# Patient Record
Sex: Male | Born: 1965 | Race: White | Hispanic: No | Marital: Married | Smoking: Never smoker
Health system: Southern US, Community
[De-identification: ages and names within clinical notes are randomized; demographics above are authoritative.]

## PROBLEM LIST (undated history)

## (undated) DIAGNOSIS — E213 Hyperparathyroidism, unspecified: Secondary | ICD-10-CM

## (undated) DIAGNOSIS — F419 Anxiety disorder, unspecified: Secondary | ICD-10-CM

## (undated) DIAGNOSIS — I10 Essential (primary) hypertension: Secondary | ICD-10-CM

## (undated) HISTORY — PX: OTHER SURGICAL HISTORY: SHX169

## (undated) HISTORY — PX: EYE SURGERY: SHX253

---

## 2009-09-26 ENCOUNTER — Ambulatory Visit (HOSPITAL_COMMUNITY): Admission: RE | Admit: 2009-09-26 | Discharge: 2009-09-26 | Payer: Self-pay | Admitting: Family Medicine

## 2009-09-29 ENCOUNTER — Ambulatory Visit (HOSPITAL_COMMUNITY): Admission: RE | Admit: 2009-09-29 | Discharge: 2009-09-29 | Payer: Self-pay | Admitting: Family Medicine

## 2012-07-18 ENCOUNTER — Encounter: Payer: Self-pay | Admitting: Gastroenterology

## 2012-07-31 ENCOUNTER — Encounter: Payer: Self-pay | Admitting: Gastroenterology

## 2012-07-31 ENCOUNTER — Ambulatory Visit (INDEPENDENT_AMBULATORY_CARE_PROVIDER_SITE_OTHER): Payer: Federal, State, Local not specified - PPO | Admitting: Gastroenterology

## 2012-07-31 VITALS — BP 130/90 | HR 80 | Ht 66.75 in | Wt 208.4 lb

## 2012-07-31 DIAGNOSIS — R1012 Left upper quadrant pain: Secondary | ICD-10-CM

## 2012-07-31 DIAGNOSIS — K644 Residual hemorrhoidal skin tags: Secondary | ICD-10-CM | POA: Insufficient documentation

## 2012-07-31 NOTE — Assessment & Plan Note (Signed)
Patient is asymptomatic.

## 2012-07-31 NOTE — Progress Notes (Signed)
History of Present Illness:  This 46 year old white male referred at the request of Dr. Regino Schultze for evaluation of abdominal pain. For approximately 2 months he was complaining of pain in the left upper quadrant. It was a relatively mild and achy.   Pain was unaffected by eating or bowel movements. It coincided with his son leaving for basic training. Over the last 2-3 weeks pain has entirely subsided. He denies pyrosis, nausea or change of bowel habits. He rarely takes Naprosyn. He also is aware of a hemorrhoid although he denies rectal pain or bleeding.    History reviewed. No pertinent past medical history. Past Surgical History  Procedure Date  . Temporal lesion removed     left   family history includes Heart attack in his father and Lung cancer in his maternal grandmother. Current Outpatient Prescriptions  Medication Sig Dispense Refill  . Naproxen Sodium (ALEVE) 220 MG CAPS Take by mouth as needed.       Allergies as of 07/31/2012  . (No Known Allergies)    reports that he quit smoking about 30 years ago. His smoking use included Cigarettes. He has never used smokeless tobacco. He reports that he drinks alcohol. He reports that he does not use illicit drugs.     Review of Systems: Pertinent positive and negative review of systems were noted in the above HPI section. All other review of systems were otherwise negative.  Vital signs were reviewed in today's medical record Physical Exam: General: Well developed , well nourished, no acute distress Head: Normocephalic and atraumatic Eyes:  sclerae anicteric, EOMI Ears: Normal auditory acuity Mouth: No deformity or lesions Neck: Supple, no masses or thyromegaly Lungs: Clear throughout to auscultation Heart: Regular rate and rhythm; no murmurs, rubs or bruits Abdomen: Soft, non tender and non distended. No masses, hepatosplenomegaly or hernias noted. Normal Bowel sounds Rectal: There is a small external hemorrhoid Musculoskeletal:  Symmetrical with no gross deformities  Skin: No lesions on visible extremities Pulses:  Normal pulses noted Extremities: No clubbing, cyanosis, edema or deformities noted Neurological: Alert oriented x 4, grossly nonfocal Cervical Nodes:  No significant cervical adenopathy Inguinal Nodes: No significant inguinal adenopathy Psychological:  Alert and cooperative. Normal mood and affect

## 2012-07-31 NOTE — Assessment & Plan Note (Signed)
Left upper quadrant pain essentially resolved. This could have been do to ulcer or nonulcer dyspepsia.  Patient was instructed to try an over-the-counter H2 receptor antagonist or PPI if pain recurs. Should pain not responding to empiric therapy with in one to 2 weeks he was carefully instructed to contact me at which point I would consider upper endoscopy.

## 2012-07-31 NOTE — Patient Instructions (Addendum)
Call back if you are no better in 1 week

## 2014-02-23 ENCOUNTER — Ambulatory Visit (INDEPENDENT_AMBULATORY_CARE_PROVIDER_SITE_OTHER): Payer: Federal, State, Local not specified - PPO | Admitting: Urology

## 2014-02-23 DIAGNOSIS — Z3009 Encounter for other general counseling and advice on contraception: Secondary | ICD-10-CM

## 2014-03-16 ENCOUNTER — Encounter (INDEPENDENT_AMBULATORY_CARE_PROVIDER_SITE_OTHER): Payer: Federal, State, Local not specified - PPO | Admitting: Urology

## 2014-03-16 DIAGNOSIS — Z302 Encounter for sterilization: Secondary | ICD-10-CM

## 2014-03-30 ENCOUNTER — Ambulatory Visit: Payer: Federal, State, Local not specified - PPO | Admitting: Urology

## 2014-05-04 ENCOUNTER — Ambulatory Visit (INDEPENDENT_AMBULATORY_CARE_PROVIDER_SITE_OTHER): Payer: Self-pay | Admitting: Urology

## 2014-05-04 DIAGNOSIS — Z09 Encounter for follow-up examination after completed treatment for conditions other than malignant neoplasm: Secondary | ICD-10-CM

## 2015-10-11 ENCOUNTER — Encounter (HOSPITAL_COMMUNITY): Payer: Self-pay | Admitting: Emergency Medicine

## 2015-10-11 ENCOUNTER — Emergency Department (HOSPITAL_COMMUNITY): Payer: Federal, State, Local not specified - PPO

## 2015-10-11 ENCOUNTER — Emergency Department (HOSPITAL_COMMUNITY)
Admission: EM | Admit: 2015-10-11 | Discharge: 2015-10-11 | Disposition: A | Discharge status: Home / Self Care | Payer: Federal, State, Local not specified - PPO | Attending: Emergency Medicine | Admitting: Emergency Medicine

## 2015-10-11 DIAGNOSIS — I10 Essential (primary) hypertension: Secondary | ICD-10-CM | POA: Diagnosis not present

## 2015-10-11 DIAGNOSIS — Z87891 Personal history of nicotine dependence: Secondary | ICD-10-CM | POA: Diagnosis not present

## 2015-10-11 DIAGNOSIS — R42 Dizziness and giddiness: Secondary | ICD-10-CM

## 2015-10-11 LAB — URINALYSIS, ROUTINE W REFLEX MICROSCOPIC
Bilirubin Urine: NEGATIVE
GLUCOSE, UA: NEGATIVE mg/dL
HGB URINE DIPSTICK: NEGATIVE
Ketones, ur: NEGATIVE mg/dL
Leukocytes, UA: NEGATIVE
Nitrite: NEGATIVE
Protein, ur: NEGATIVE mg/dL
SPECIFIC GRAVITY, URINE: 1.017 (ref 1.005–1.030)
pH: 8 (ref 5.0–8.0)

## 2015-10-11 LAB — BASIC METABOLIC PANEL
ANION GAP: 9 (ref 5–15)
BUN: 12 mg/dL (ref 6–20)
CHLORIDE: 105 mmol/L (ref 101–111)
CO2: 27 mmol/L (ref 22–32)
Calcium: 10.8 mg/dL — ABNORMAL HIGH (ref 8.9–10.3)
Creatinine, Ser: 0.92 mg/dL (ref 0.61–1.24)
GFR calc non Af Amer: >60 mL/min (ref >60)
Glucose, Bld: 132 mg/dL — ABNORMAL HIGH (ref 65–99)
Potassium: 4.2 mmol/L (ref 3.5–5.1)
Sodium: 141 mmol/L (ref 135–145)

## 2015-10-11 LAB — CBC
HCT: 45.7 % (ref 39.0–52.0)
HEMOGLOBIN: 16.4 g/dL (ref 13.0–17.0)
MCH: 33.1 pg (ref 26.0–34.0)
MCHC: 35.9 g/dL (ref 30.0–36.0)
MCV: 92.3 fL (ref 78.0–100.0)
Platelets: 158 10*3/uL (ref 150–400)
RBC: 4.95 MIL/uL (ref 4.22–5.81)
RDW: 12.6 % (ref 11.5–15.5)
WBC: 6.9 10*3/uL (ref 4.0–10.5)

## 2015-10-11 LAB — I-STAT TROPONIN, ED: TROPONIN I, POC: 0.01 ng/mL (ref 0.00–0.08)

## 2015-10-11 NOTE — ED Notes (Signed)
Patient transported to X-ray 

## 2015-10-11 NOTE — Discharge Instructions (Signed)
Today's workup without any acute findings. However your blood pressure has been elevated. Important that she get follow-up on that she may need treatment for hypertension. Today's cardiac workup without any acute findings labs without significant abnormalities. Return for any new or worse symptoms at all.

## 2015-10-11 NOTE — ED Provider Notes (Signed)
CSN: [id number]     Arrival date & time 10/11/15  1353 History   First MD Initiated Contact with Patient 10/11/15 1632     Chief Complaint  Patient presents with  . Dizziness  . L shoulder pain      (Consider location/radiation/quality/duration/timing/severity/associated sxs/prior Treatment) Patient is a 50 y.o. male presenting with dizziness. The history is provided by the patient.  Dizziness Associated symptoms: no chest pain, no nausea, no palpitations, no shortness of breath and no weakness    patient presenting today with 2 complaints. One is left shoulder pain that started at 650 this morning when he awoke. Patient's had difficulty with that in the past and he seems it's due to sleeping on it wrong. Patient not concerned her alarmed by that. At 1:15 this afternoon patient had a sudden onset of lightheadedness feeling a little bit of dizziness no vertigo. Did not feel like he was got pass out no headache no shortness of breath no nausea vomiting no headache no chest pain. No abdominal pain. The symptoms lasted just a few seconds and have now completely resolved. Patient never had anything like that happen before.  History reviewed. No pertinent past medical history. Past Surgical History  Procedure Laterality Date  . Temporal lesion removed      left   Family History  Problem Relation Age of Onset  . Heart attack Father   . Lung cancer Maternal Grandmother    Social History  Substance Use Topics  . Smoking status: Former Smoker    Types: Cigarettes    Quit date: 09/17/1981  . Smokeless tobacco: Never Used  . Alcohol Use: Yes     Comment: 3-4 beers per weekend    Review of Systems  Constitutional: Negative for fever.  HENT: Negative for congestion.   Eyes: Negative for visual disturbance.  Respiratory: Negative for shortness of breath.   Cardiovascular: Negative for chest pain, palpitations and leg swelling.  Gastrointestinal: Negative for nausea and abdominal pain.   Genitourinary: Negative for dysuria.  Musculoskeletal: Negative for back pain and neck pain.  Skin: Negative for rash.  Neurological: Positive for dizziness and light-headedness. Negative for syncope, speech difficulty, weakness and numbness.  Hematological: Does not bruise/bleed easily.  Psychiatric/Behavioral: Negative for confusion.      Allergies  Review of patient's allergies indicates no known allergies.  Home Medications   Prior to Admission medications   Not on File   BP 162/112 mmHg  Pulse 113  Temp(Src) 98.9 F (37.2 C) (Oral)  Resp 18  SpO2 95% Physical Exam  Constitutional: He appears well-developed and well-nourished. No distress.  HENT:  Head: Normocephalic and atraumatic.  Mouth/Throat: Oropharynx is clear and moist.  Eyes: EOM are normal. Pupils are equal, round, and reactive to light.  Neck: Normal range of motion. Neck supple.  Cardiovascular: Normal rate, regular rhythm and normal heart sounds.   Pulmonary/Chest: Effort normal and breath sounds normal. No respiratory distress.  Abdominal: Soft. Bowel sounds are normal. There is no tenderness.  Musculoskeletal: Normal range of motion. He exhibits no edema or tenderness.  Neurological: He is alert. No cranial nerve deficit. He exhibits normal muscle tone. Coordination normal.  Skin: Skin is warm. No rash noted. No erythema.  Vitals reviewed.   ED Course  Procedures (including critical care time) Labs Review Labs Reviewed  BASIC METABOLIC PANEL - Abnormal; Notable for the following:    Glucose, Bld 132 (*)    Calcium 10.8 (*)    All other components  within normal limits  CBC  URINALYSIS, ROUTINE W REFLEX MICROSCOPIC (NOT AT [facility])  CBG MONITORING, ED  I-STAT TROPOININ, ED   Results for orders placed or performed during the hospital encounter of [id number]  Basic metabolic panel  Result Value Ref Range   Sodium 141 135 - 145 mmol/L   Potassium 4.2 3.5 - 5.1 mmol/L   Chloride 105 101 - 111 mmol/L    CO2 27 22 - 32 mmol/L   Glucose, Bld 132 (H) 65 - 99 mg/dL   BUN 12 6 - 20 mg/dL   Creatinine, Ser 0.92 0.61 - 1.24 mg/dL   Calcium 10.8 (H) 8.9 - 10.3 mg/dL   GFR calc non Af Amer >60 >60 mL/min   GFR calc Af Amer >60 >60 mL/min   Anion gap 9 5 - 15  CBC  Result Value Ref Range   WBC 6.9 4.0 - 10.5 K/uL   RBC 4.95 4.22 - 5.81 MIL/uL   Hemoglobin 16.4 13.0 - 17.0 g/dL   HCT 45.7 39.0 - 52.0 %   MCV 92.3 78.0 - 100.0 fL   MCH 33.1 26.0 - 34.0 pg   MCHC 35.9 30.0 - 36.0 g/dL   RDW 12.6 11.5 - 15.5 %   Platelets 158 150 - 400 K/uL  Urinalysis, Routine w reflex microscopic (not at [facility])  Result Value Ref Range   Color, Urine YELLOW YELLOW   APPearance CLEAR CLEAR   Specific Gravity, Urine 1.017 1.005 - 1.030   pH 8.0 5.0 - 8.0   Glucose, UA NEGATIVE NEGATIVE mg/dL   Hgb urine dipstick NEGATIVE NEGATIVE   Bilirubin Urine NEGATIVE NEGATIVE   Ketones, ur NEGATIVE NEGATIVE mg/dL   Protein, ur NEGATIVE NEGATIVE mg/dL   Nitrite NEGATIVE NEGATIVE   Leukocytes, UA NEGATIVE NEGATIVE  I-Stat Troponin, ED (not at [facility])  Result Value Ref Range   Troponin i, poc 0.01 0.00 - 0.08 ng/mL   Comment 3             Imaging Review Dg Chest 2 View  10/11/2015  CLINICAL DATA:  50 year old male with acute pain and dizziness. EXAM: CHEST  2 VIEW COMPARISON:  09/26/2009 FINDINGS: The cardiomediastinal silhouette is unremarkable. There is no evidence of focal airspace disease, pulmonary edema, suspicious pulmonary nodule/mass, pleural effusion, or pneumothorax. No acute bony abnormalities are identified. IMPRESSION: No active cardiopulmonary disease. Electronically Signed   By: Margarette Canada M.D.   On: 10/11/2015 17:28   I have personally reviewed and evaluated these images and lab results as part of my medical decision-making.   EKG Interpretation   Date/Time:  Tuesday October 11 2015 14:46:03 EST Ventricular Rate:  108 PR Interval:  146 QRS Duration: 84 QT Interval:  324 QTC Calculation:  434 R Axis:   46 Text Interpretation:  Sinus tachycardia ST \\T \ T wave abnormality,  consider inferolateral ischemia Abnormal ECG No previous ECGs available  Confirmed by Lamir Racca  MD, Berneda Piccininni [phone]) on 10/11/2015 4:41:29 PM      MDM   Final diagnoses:  Essential hypertension  Dizziness    The patient presented with the complaint of some intermittent episodes of dizziness lightheadedness no true vertigo no near-syncope lasting just seconds that occurred at about 1:15 PM. No shortness of breath no nausea vomiting no headache no chest pain. Patient's room air sat was 96%. Patient heart rates been slightly tachycardic. Chest x-ray negative troponin negative labs including urine without significant abnormalities. Patient also had the complaint of left shoulder pain that he noticed  upon awakening at the 650 in the morning. He's had difficulty with that in the past and feels that that's due for sleeping on the shoulder wrong.  Workup does not appear to be an acute cardiac event there is no true vertigo. Do not feel that there is evidence of a pulmonary embolus although there is a little bit tachycardia is no hypoxia is no shortness of breath. No chest pain.  Patient's blood pressure has been consistently elevated here and patient will need close follow-up blood pressure checked by the primary care provider. Patient may very well require treatment for hypertension. Patient will make an appointment to follow-up with his doctor. Patient will return for any new or worse symptoms.    Fredia Sorrow, MD 10/11/15 2106

## 2015-10-11 NOTE — ED Notes (Signed)
MD at bedside. 

## 2015-10-11 NOTE — ED Notes (Signed)
Patient states started feeling dizzy when he was coming out of a store today.  Patient states it started around 1321.  Patient states also had L shoulder/arm pain.  Denies chest pain, denies any neuro problems.  Alert and Oriented x 4.  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   Neuro intact.

## 2015-10-27 ENCOUNTER — Encounter (INDEPENDENT_AMBULATORY_CARE_PROVIDER_SITE_OTHER): Payer: Self-pay | Admitting: *Deleted

## 2015-11-09 ENCOUNTER — Encounter (INDEPENDENT_AMBULATORY_CARE_PROVIDER_SITE_OTHER): Payer: Self-pay | Admitting: *Deleted

## 2015-11-10 ENCOUNTER — Other Ambulatory Visit (INDEPENDENT_AMBULATORY_CARE_PROVIDER_SITE_OTHER): Payer: Self-pay | Admitting: *Deleted

## 2015-11-10 DIAGNOSIS — Z1211 Encounter for screening for malignant neoplasm of colon: Secondary | ICD-10-CM

## 2015-12-16 ENCOUNTER — Other Ambulatory Visit (INDEPENDENT_AMBULATORY_CARE_PROVIDER_SITE_OTHER): Payer: Self-pay | Admitting: *Deleted

## 2015-12-16 ENCOUNTER — Encounter (INDEPENDENT_AMBULATORY_CARE_PROVIDER_SITE_OTHER): Payer: Self-pay | Admitting: *Deleted

## 2015-12-16 NOTE — Telephone Encounter (Signed)
movi

## 2015-12-19 MED ORDER — PEG-KCL-NACL-NASULF-NA ASC-C 100 G PO SOLR: 1.0000 | Freq: Once | ORAL | Status: DC

## 2015-12-28 ENCOUNTER — Telehealth (INDEPENDENT_AMBULATORY_CARE_PROVIDER_SITE_OTHER): Payer: Self-pay | Admitting: *Deleted

## 2015-12-28 NOTE — Telephone Encounter (Signed)
Referring MD/PCP: hall   Procedure: tcs  Reason/Indication:  screening  Has patient had this procedure before?  no  If so, when, by whom and where?    Is there a family history of colon cancer?  no  Who?  What age when diagnosed?    Is patient diabetic?   no      Does patient have prosthetic heart valve or mechanical valve?  no  Do you have a pacemaker?  no  Has patient ever had endocarditis? no  Has patient had joint replacement within last 12 months?  no  Does patient tend to be constipated or take laxatives? no  Does patient have a history of alcohol/drug use?  no  Is patient on Coumadin, Plavix and/or Aspirin? yes  Medications: asa 81 mg prn, lisinopril 10 mg daily  Allergies: nkda  Medication Adjustment: asa 2 days  Procedure date & time: 01/26/16 at 830

## 2015-12-28 NOTE — Telephone Encounter (Signed)
agree

## 2016-01-16 DIAGNOSIS — K080 Exfoliation of teeth due to systemic causes: Secondary | ICD-10-CM | POA: Diagnosis not present

## 2016-01-26 ENCOUNTER — Encounter (HOSPITAL_COMMUNITY)
Admission: RE | Disposition: A | Discharge status: Home / Self Care | Payer: Self-pay | Source: Ambulatory Visit | Attending: Internal Medicine

## 2016-01-26 ENCOUNTER — Encounter (HOSPITAL_COMMUNITY): Payer: Self-pay

## 2016-01-26 ENCOUNTER — Ambulatory Visit (HOSPITAL_COMMUNITY)
Admission: RE | Admit: 2016-01-26 | Discharge: 2016-01-26 | Disposition: A | Discharge status: Home / Self Care | Payer: Federal, State, Local not specified - PPO | Source: Ambulatory Visit | Attending: Internal Medicine | Admitting: Internal Medicine

## 2016-01-26 DIAGNOSIS — K6289 Other specified diseases of anus and rectum: Secondary | ICD-10-CM | POA: Diagnosis not present

## 2016-01-26 DIAGNOSIS — Z79899 Other long term (current) drug therapy: Secondary | ICD-10-CM | POA: Insufficient documentation

## 2016-01-26 DIAGNOSIS — I10 Essential (primary) hypertension: Secondary | ICD-10-CM | POA: Diagnosis not present

## 2016-01-26 DIAGNOSIS — K648 Other hemorrhoids: Secondary | ICD-10-CM | POA: Insufficient documentation

## 2016-01-26 DIAGNOSIS — Z87891 Personal history of nicotine dependence: Secondary | ICD-10-CM | POA: Diagnosis not present

## 2016-01-26 DIAGNOSIS — Z1211 Encounter for screening for malignant neoplasm of colon: Secondary | ICD-10-CM

## 2016-01-26 HISTORY — PX: COLONOSCOPY: SHX5424

## 2016-01-26 HISTORY — DX: Essential (primary) hypertension: I10

## 2016-01-26 SURGERY — COLONOSCOPY
Anesthesia: Moderate Sedation

## 2016-01-26 MED ORDER — SIMETHICONE 40 MG/0.6ML PO SUSP
ORAL | Status: DC | PRN
  Administered 2016-01-26: 09:00:00

## 2016-01-26 MED ORDER — SODIUM CHLORIDE 0.9 % IV SOLN
INTRAVENOUS | Status: DC
  Administered 2016-01-26: 08:00:00 via INTRAVENOUS

## 2016-01-26 MED ORDER — MIDAZOLAM HCL 5 MG/5ML IJ SOLN
INTRAMUSCULAR | Status: DC | PRN
  Administered 2016-01-26 (×4): 2 mg via INTRAVENOUS

## 2016-01-26 MED ORDER — MIDAZOLAM HCL 5 MG/5ML IJ SOLN
INTRAMUSCULAR | Status: AC
  Filled 2016-01-26: qty 10

## 2016-01-26 MED ORDER — MEPERIDINE HCL 50 MG/ML IJ SOLN
INTRAMUSCULAR | Status: DC | PRN
  Administered 2016-01-26 (×2): 25 mg via INTRAVENOUS

## 2016-01-26 MED ORDER — MEPERIDINE HCL 50 MG/ML IJ SOLN
INTRAMUSCULAR | Status: AC
  Filled 2016-01-26: qty 1

## 2016-01-26 NOTE — Op Note (Signed)
[facility] Patient Name: Jordan Reid Procedure Date: 01/26/2016 8:54 AM MRN: [id number] Date of Birth: Mar 29, 1966 Attending MD: Hildred Laser , MD CSN: [id number] Age: 50 Admit Type: Outpatient Procedure:                Colonoscopy Indications:              Screening for colorectal malignant neoplasm Providers:                Hildred Laser, MD, Rosina Lowenstein, RN, Georgeann Oppenheim,                            Technician Referring MD:             Delphina Cahill, MD Medicines:                Meperidine 50 mg IV, Midazolam 8 mg IV Complications:            No immediate complications. Estimated Blood Loss:     Estimated blood loss: none. Procedure:                Pre-Anesthesia Assessment:                           - Prior to the procedure, a History and Physical                            was performed, and patient medications and                            allergies were reviewed. The patient's tolerance of                            previous anesthesia was also reviewed. The risks                            and benefits of the procedure and the sedation                            options and risks were discussed with the patient.                            All questions were answered, and informed consent                            was obtained. Prior Anticoagulants: The patient has                            taken no previous anticoagulant or antiplatelet                            agents. ASA Grade Assessment: I - A normal, healthy                            patient. After reviewing the risks and benefits,  the patient was deemed in satisfactory condition to                            undergo the procedure.                           After obtaining informed consent, the colonoscope                            was passed under direct vision. Throughout the                            procedure, the patient's blood pressure, pulse, and   oxygen saturations were monitored continuously. The                            EC-[id number]) scope was introduced through                            the anus and advanced to the the cecum, identified                            by appendiceal orifice and ileocecal valve. The                            colonoscopy was performed without difficulty. The                            patient tolerated the procedure well. The quality                            of the bowel preparation was excellent. The                            ileocecal valve, appendiceal orifice, and rectum                            were photographed. Scope In: 9:03:57 AM Scope Out: 9:17:15 AM Scope Withdrawal Time: 0 hours 7 minutes 22 seconds  Total Procedure Duration: 0 hours 13 minutes 18 seconds  Findings:      The colon (entire examined portion) appeared normal.      Internal hemorrhoids were found during retroflexion. The hemorrhoids       were small.      Anal papilla(e) were hypertrophied. Impression:               - The entire examined colon is normal.                           - Internal hemorrhoids.                           - Small anal papillae.                           - No specimens collected. Moderate Sedation:  Moderate (conscious) sedation was administered by the endoscopy nurse       and supervised by the endoscopist. The following parameters were       monitored: oxygen saturation, heart rate, blood pressure, CO2       capnography and response to care. Total physician intraservice time was       22 minutes. Recommendation:           - Patient has a contact number available for                            emergencies. The signs and symptoms of potential                            delayed complications were discussed with the                            patient. Return to normal activities tomorrow.                            Written discharge instructions were provided to the                             patient.                           - Resume previous diet.                           - Repeat colonoscopy in 10 years for screening                            purposes.                           - Continue present medications. Procedure Code(s):        --- Professional ---                           [phone], Colonoscopy, flexible; diagnostic, including                            collection of specimen(s) by brushing or washing,                            when performed (separate procedure)                           99152, Moderate sedation services provided by the                            same physician or other qualified health care                            professional performing the diagnostic or  therapeutic service that the sedation supports,                            requiring the presence of an independent trained                            observer to assist in the monitoring of the                            patient's level of consciousness and physiological                            status; initial 15 minutes of intraservice time,                            patient age 57 years or older Diagnosis Code(s):        --- Professional ---                           Z12.11, Encounter for screening for malignant                            neoplasm of colon                           K64.8, Other hemorrhoids                           K62.89, Other specified diseases of anus and rectum CPT copyright 2016 [employer]. All rights reserved. The codes documented in this report are preliminary and upon coder review may  be revised to meet current compliance requirements. Hildred Laser, MD Hildred Laser, MD 01/26/2016 9:25:23 AM This report has been signed electronically. Number of Addenda: 0

## 2016-01-26 NOTE — H&P (Signed)
Jordan Reid is an 50 y.o. male.   Chief Complaint: Patient is here for colonoscopy. HPI: Patient is 50 year old Caucasian male who is here for screening colonoscopy. He denies abdominal pain change in bowel habits or rectal bleeding. Family history is negative for CRC.  Past Medical History  Diagnosis Date  . Hypertension     Past Surgical History  Procedure Laterality Date  . Temporal lesion removed      left    Family History  Problem Relation Age of Onset  . Heart attack Father   . Lung cancer Maternal Grandmother    Social History:  reports that he quit smoking about 34 years ago. His smoking use included Cigarettes. He has never used smokeless tobacco. He reports that he drinks alcohol. He reports that he does not use illicit drugs.  Allergies: No Known Allergies  Medications Prior to Admission  Medication Sig Dispense Refill  . lisinopril (PRINIVIL,ZESTRIL) 10 MG tablet Take 10 mg by mouth daily.    . peg 3350 powder (MOVIPREP) 100 g SOLR Take 1 kit (200 g total) by mouth once. 1 kit 0    No results found for this or any previous visit (from the past 48 hour(s)). No results found.  ROS  Blood pressure 135/91, pulse 107, temperature 98 F (36.7 C), temperature source Oral, resp. rate 15, height 5' 7"  (1.702 m), weight 188 lb (85.276 kg), SpO2 100 %. Physical Exam  Constitutional: He appears well-developed and well-nourished.  HENT:  Mouth/Throat: Oropharynx is clear and moist.  Eyes: Conjunctivae are normal. No scleral icterus.  Neck: No thyromegaly present.  Cardiovascular: Normal rate, regular rhythm and normal heart sounds.   No murmur heard. Respiratory: Effort normal and breath sounds normal.  GI: Soft. He exhibits no distension and no mass. There is no tenderness.  Musculoskeletal: He exhibits no edema.  Lymphadenopathy:    He has no cervical adenopathy.  Neurological: He is alert.  Skin: Skin is warm and dry.     Assessment/Plan Average risk  screening colonoscopy.  Rogene Houston, MD 01/26/2016, 8:52 AM

## 2016-01-26 NOTE — Discharge Instructions (Signed)
Resume usual medications and diet. No driving for 24 hours. Next screening exam in 10 years.     Colonoscopy, Care After These instructions give you information on caring for yourself after your procedure. Your doctor may also give you more specific instructions. Call your doctor if you have any problems or questions after your procedure. HOME CARE  Do not drive for 24 hours.  Do not sign important papers or use machinery for 24 hours.  You may shower.  You may go back to your usual activities, but go slower for the first 24 hours.  Take rest breaks often during the first 24 hours.  Walk around or use warm packs on your belly (abdomen) if you have belly cramping or gas.  Drink enough fluids to keep your pee (urine) clear or pale yellow.  Resume your normal diet. Avoid heavy or fried foods.  Avoid drinking alcohol for 24 hours or as told by your doctor.  Only take medicines as told by your doctor. If a tissue sample (biopsy) was taken during the procedure:   Do not take aspirin or blood thinners for 7 days, or as told by your doctor.  Do not drink alcohol for 7 days, or as told by your doctor.  Eat soft foods for the first 24 hours. GET HELP IF: You still have a small amount of blood in your poop (stool) 2-3 days after the procedure. GET HELP RIGHT AWAY IF:  You have more than a small amount of blood in your poop.  You see clumps of tissue (blood clots) in your poop.  Your belly is puffy (swollen).  You feel sick to your stomach (nauseous) or throw up (vomit).  You have a fever.  You have belly pain that gets worse and medicine does not help. MAKE SURE YOU:  Understand these instructions.  Will watch your condition.  Will get help right away if you are not doing well or get worse.   This information is not intended to replace advice given to you by your health care provider. Make sure you discuss any questions you have with your health care provider.     Document Released: 10/06/2010 Document Revised: 09/08/2013 Document Reviewed: 05/11/2013 Elsevier Interactive Patient Education 2016 [employer].    Hemorrhoids Hemorrhoids are swollen veins around the rectum or anus. There are two types of hemorrhoids:   Internal hemorrhoids. These occur in the veins just inside the rectum. They may poke through to the outside and become irritated and painful.  External hemorrhoids. These occur in the veins outside the anus and can be felt as a painful swelling or hard lump near the anus. CAUSES  Pregnancy.   Obesity.   Constipation or diarrhea.   Straining to have a bowel movement.   Sitting for long periods on the toilet.  Heavy lifting or other activity that caused you to strain.  Anal intercourse. SYMPTOMS   Pain.   Anal itching or irritation.   Rectal bleeding.   Fecal leakage.   Anal swelling.   One or more lumps around the anus.  DIAGNOSIS  Your caregiver may be able to diagnose hemorrhoids by visual examination. Other examinations or tests that may be performed include:   Examination of the rectal area with a gloved hand (digital rectal exam).   Examination of anal canal using a small tube (scope).   A blood test if you have lost a significant amount of blood.  A test to look inside the colon (sigmoidoscopy or  colonoscopy). TREATMENT Most hemorrhoids can be treated at home. However, if symptoms do not seem to be getting better or if you have a lot of rectal bleeding, your caregiver may perform a procedure to help make the hemorrhoids get smaller or remove them completely. Possible treatments include:   Placing a rubber band at the base of the hemorrhoid to cut off the circulation (rubber band ligation).   Injecting a chemical to shrink the hemorrhoid (sclerotherapy).   Using a tool to burn the hemorrhoid (infrared light therapy).   Surgically removing the hemorrhoid (hemorrhoidectomy).    Stapling the hemorrhoid to block blood flow to the tissue (hemorrhoid stapling).  HOME CARE INSTRUCTIONS   Eat foods with fiber, such as whole grains, beans, nuts, fruits, and vegetables. Ask your doctor about taking products with added fiber in them (fibersupplements).  Increase fluid intake. Drink enough water and fluids to keep your urine clear or pale yellow.   Exercise regularly.   Go to the bathroom when you have the urge to have a bowel movement. Do not wait.   Avoid straining to have bowel movements.   Keep the anal area dry and clean. Use wet toilet paper or moist towelettes after a bowel movement.   Medicated creams and suppositories may be used or applied as directed.   Only take over-the-counter or prescription medicines as directed by your caregiver.   Take warm sitz baths for 15-20 minutes, 3-4 times a day to ease pain and discomfort.   Place ice packs on the hemorrhoids if they are tender and swollen. Using ice packs between sitz baths may be helpful.   Put ice in a plastic bag.   Place a towel between your skin and the bag.   Leave the ice on for 15-20 minutes, 3-4 times a day.   Do not use a donut-shaped pillow or sit on the toilet for long periods. This increases blood pooling and pain.  SEEK MEDICAL CARE IF:  You have increasing pain and swelling that is not controlled by treatment or medicine.  You have uncontrolled bleeding.  You have difficulty or you are unable to have a bowel movement.  You have pain or inflammation outside the area of the hemorrhoids. MAKE SURE YOU:  Understand these instructions.  Will watch your condition.  Will get help right away if you are not doing well or get worse.   This information is not intended to replace advice given to you by your health care provider. Make sure you discuss any questions you have with your health care provider.   Document Released: 08/31/2000 Document Revised: 08/20/2012  Document Reviewed: 07/08/2012 Elsevier Interactive Patient Education [employer].

## 2016-01-27 ENCOUNTER — Encounter (HOSPITAL_COMMUNITY): Payer: Self-pay | Admitting: Internal Medicine

## 2016-01-31 DIAGNOSIS — K080 Exfoliation of teeth due to systemic causes: Secondary | ICD-10-CM | POA: Diagnosis not present

## 2016-04-06 DIAGNOSIS — G44219 Episodic tension-type headache, not intractable: Secondary | ICD-10-CM | POA: Diagnosis not present

## 2016-04-19 DIAGNOSIS — E782 Mixed hyperlipidemia: Secondary | ICD-10-CM | POA: Diagnosis not present

## 2016-04-24 DIAGNOSIS — I10 Essential (primary) hypertension: Secondary | ICD-10-CM | POA: Diagnosis not present

## 2016-04-24 DIAGNOSIS — R531 Weakness: Secondary | ICD-10-CM | POA: Diagnosis not present

## 2016-04-24 DIAGNOSIS — E782 Mixed hyperlipidemia: Secondary | ICD-10-CM | POA: Diagnosis not present

## 2016-04-24 DIAGNOSIS — R5383 Other fatigue: Secondary | ICD-10-CM | POA: Diagnosis not present

## 2016-05-16 DIAGNOSIS — E210 Primary hyperparathyroidism: Secondary | ICD-10-CM | POA: Diagnosis not present

## 2016-05-17 ENCOUNTER — Other Ambulatory Visit (HOSPITAL_COMMUNITY): Payer: Self-pay | Admitting: Surgery

## 2016-05-17 DIAGNOSIS — E210 Primary hyperparathyroidism: Secondary | ICD-10-CM

## 2016-05-22 DIAGNOSIS — E210 Primary hyperparathyroidism: Secondary | ICD-10-CM | POA: Diagnosis not present

## 2016-05-24 ENCOUNTER — Encounter (HOSPITAL_COMMUNITY)
Admission: RE | Admit: 2016-05-24 | Discharge: 2016-05-24 | Disposition: A | Discharge status: Home / Self Care | Payer: Federal, State, Local not specified - PPO | Source: Ambulatory Visit | Attending: Surgery | Admitting: Surgery

## 2016-05-24 DIAGNOSIS — E210 Primary hyperparathyroidism: Secondary | ICD-10-CM | POA: Diagnosis not present

## 2016-05-24 DIAGNOSIS — E213 Hyperparathyroidism, unspecified: Secondary | ICD-10-CM | POA: Diagnosis not present

## 2016-05-24 MED ORDER — TECHNETIUM TC 99M SESTAMIBI GENERIC - CARDIOLITE: 25.0000 | Freq: Once | INTRAVENOUS | Status: DC | PRN

## 2016-05-25 DIAGNOSIS — F411 Generalized anxiety disorder: Secondary | ICD-10-CM | POA: Diagnosis not present

## 2016-05-25 DIAGNOSIS — I10 Essential (primary) hypertension: Secondary | ICD-10-CM | POA: Diagnosis not present

## 2016-06-05 ENCOUNTER — Other Ambulatory Visit: Payer: Self-pay | Admitting: Surgery

## 2016-06-05 DIAGNOSIS — E210 Primary hyperparathyroidism: Secondary | ICD-10-CM

## 2016-06-08 ENCOUNTER — Ambulatory Visit
Admission: RE | Admit: 2016-06-08 | Discharge: 2016-06-08 | Disposition: A | Discharge status: Home / Self Care | Payer: Federal, State, Local not specified - PPO | Source: Ambulatory Visit | Attending: Surgery | Admitting: Surgery

## 2016-06-08 DIAGNOSIS — E213 Hyperparathyroidism, unspecified: Secondary | ICD-10-CM | POA: Diagnosis not present

## 2016-06-08 DIAGNOSIS — E210 Primary hyperparathyroidism: Secondary | ICD-10-CM

## 2016-06-13 ENCOUNTER — Other Ambulatory Visit: Payer: Self-pay | Admitting: Surgery

## 2016-06-13 DIAGNOSIS — E210 Primary hyperparathyroidism: Secondary | ICD-10-CM

## 2016-06-15 ENCOUNTER — Other Ambulatory Visit: Payer: Federal, State, Local not specified - PPO

## 2016-06-19 ENCOUNTER — Ambulatory Visit
Admission: RE | Admit: 2016-06-19 | Discharge: 2016-06-19 | Disposition: A | Discharge status: Home / Self Care | Payer: Federal, State, Local not specified - PPO | Source: Ambulatory Visit | Attending: Surgery | Admitting: Surgery

## 2016-06-19 DIAGNOSIS — D351 Benign neoplasm of parathyroid gland: Secondary | ICD-10-CM | POA: Diagnosis not present

## 2016-06-19 DIAGNOSIS — E210 Primary hyperparathyroidism: Secondary | ICD-10-CM

## 2016-06-19 MED ORDER — IOPAMIDOL (ISOVUE-300) INJECTION 61%
75.0000 mL | Freq: Once | INTRAVENOUS | Status: AC | PRN
  Administered 2016-06-19: 75 mL via INTRAVENOUS

## 2016-06-19 MED ORDER — IOPAMIDOL (ISOVUE-300) INJECTION 61%: 75.0000 mL | Freq: Once | INTRAVENOUS | Status: DC | PRN

## 2016-06-27 ENCOUNTER — Ambulatory Visit: Payer: Self-pay | Admitting: Surgery

## 2016-07-12 ENCOUNTER — Encounter (HOSPITAL_COMMUNITY): Payer: Self-pay

## 2016-07-16 ENCOUNTER — Encounter (HOSPITAL_COMMUNITY): Payer: Self-pay

## 2016-07-16 ENCOUNTER — Encounter (HOSPITAL_COMMUNITY)
Admission: RE | Admit: 2016-07-16 | Discharge: 2016-07-16 | Disposition: A | Discharge status: Home / Self Care | Payer: Federal, State, Local not specified - PPO | Source: Ambulatory Visit | Attending: Surgery | Admitting: Surgery

## 2016-07-16 DIAGNOSIS — Z01810 Encounter for preprocedural cardiovascular examination: Secondary | ICD-10-CM | POA: Insufficient documentation

## 2016-07-16 DIAGNOSIS — Z01812 Encounter for preprocedural laboratory examination: Secondary | ICD-10-CM | POA: Insufficient documentation

## 2016-07-16 DIAGNOSIS — I10 Essential (primary) hypertension: Secondary | ICD-10-CM | POA: Insufficient documentation

## 2016-07-16 DIAGNOSIS — E210 Primary hyperparathyroidism: Secondary | ICD-10-CM | POA: Diagnosis not present

## 2016-07-16 HISTORY — DX: Hyperparathyroidism, unspecified: E21.3

## 2016-07-16 HISTORY — DX: Anxiety disorder, unspecified: F41.9

## 2016-07-16 LAB — CBC
HEMATOCRIT: 44.8 % (ref 39.0–52.0)
HEMOGLOBIN: 15.6 g/dL (ref 13.0–17.0)
MCH: 32.7 pg (ref 26.0–34.0)
MCHC: 34.8 g/dL (ref 30.0–36.0)
MCV: 93.9 fL (ref 78.0–100.0)
Platelets: 175 10*3/uL (ref 150–400)
RBC: 4.77 MIL/uL (ref 4.22–5.81)
RDW: 12.5 % (ref 11.5–15.5)
WBC: 8.2 10*3/uL (ref 4.0–10.5)

## 2016-07-16 LAB — BASIC METABOLIC PANEL
ANION GAP: 6 (ref 5–15)
BUN: 15 mg/dL (ref 6–20)
CHLORIDE: 106 mmol/L (ref 101–111)
CO2: 27 mmol/L (ref 22–32)
CREATININE: 0.87 mg/dL (ref 0.61–1.24)
Calcium: 11.2 mg/dL — ABNORMAL HIGH (ref 8.9–10.3)
GFR calc non Af Amer: >60 mL/min (ref >60)
Glucose, Bld: 94 mg/dL (ref 65–99)
Potassium: 4.4 mmol/L (ref 3.5–5.1)
Sodium: 139 mmol/L (ref 135–145)

## 2016-07-16 NOTE — Patient Instructions (Signed)
Jordan Reid  07/16/2016   Your procedure is scheduled on: Monday July 23, 2016  Report to [facility] Main  Entrance take [place]  elevators to 3rd floor to  Cool at 9:15 AM.  Call this number if you have problems the morning of surgery [phone]   Remember: ONLY 1 PERSON MAY GO WITH YOU TO SHORT STAY TO GET  READY MORNING OF [place].  Do not eat food or drink liquids :After Midnight.     Take these medicines the morning of surgery with A SIP OF WATER: Bupropion (Wellbutrin)                                You may not have any metal on your body including hair pins and              piercings  Do not wear jewelry,  lotions, powders or colognes, deodorant                     Men may shave face and neck.   Do not bring valuables to the hospital. [place].  Contacts, dentures or bridgework may not be worn into surgery.       Patients discharged the day of surgery will not be allowed to drive home.  Name and phone number of your driver:Jordan Reid (wife)  _____________________________________________________________________             [facility] - Preparing for Surgery Before surgery, you can play an important role.  Because skin is not sterile, your skin needs to be as free of germs as possible.  You can reduce the number of germs on your skin by washing with CHG (chlorahexidine gluconate) soap before surgery.  CHG is an antiseptic cleaner which kills germs and bonds with the skin to continue killing germs even after washing. Please DO NOT use if you have an allergy to CHG or antibacterial soaps.  If your skin becomes reddened/irritated stop using the CHG and inform your nurse when you arrive at Short Stay. Do not shave (including legs and underarms) for at least 48 hours prior to the first CHG shower.  You may shave your face/neck. Please follow these instructions  carefully:  1.  Shower with CHG Soap the night before surgery and the  morning of Surgery.  2.  If you choose to wash your hair, wash your hair first as usual with your  normal  shampoo.  3.  After you shampoo, rinse your hair and body thoroughly to remove the  shampoo.                           4.  Use CHG as you would any other liquid soap.  You can apply chg directly  to the skin and wash                       Gently with a scrungie or clean washcloth.  5.  Apply the CHG Soap to your body ONLY FROM THE NECK DOWN.   Do not use on face/ open  Wound or open sores. Avoid contact with eyes, ears mouth and genitals (private parts).                       Wash face,  Genitals (private parts) with your normal soap.             6.  Wash thoroughly, paying special attention to the area where your surgery  will be performed.  7.  Thoroughly rinse your body with warm water from the neck down.  8.  DO NOT shower/wash with your normal soap after using and rinsing off  the CHG Soap.                9.  Pat yourself dry with a clean towel.            10.  Wear clean pajamas.            11.  Place clean sheets on your bed the night of your first shower and do not  sleep with pets. Day of Surgery : Do not apply any lotions/deodorants the morning of surgery.  Please wear clean clothes to the hospital/surgery center.  FAILURE TO FOLLOW THESE INSTRUCTIONS MAY RESULT IN THE CANCELLATION OF YOUR SURGERY PATIENT SIGNATURE_________________________________  NURSE SIGNATURE__________________________________  ________________________________________________________________________

## 2016-07-18 DIAGNOSIS — K080 Exfoliation of teeth due to systemic causes: Secondary | ICD-10-CM | POA: Diagnosis not present

## 2016-07-22 ENCOUNTER — Encounter (HOSPITAL_COMMUNITY): Payer: Self-pay | Admitting: Surgery

## 2016-07-22 DIAGNOSIS — E210 Primary hyperparathyroidism: Secondary | ICD-10-CM | POA: Diagnosis present

## 2016-07-22 NOTE — H&P (Signed)
General Surgery [facility]  Jordan Reid DOB: 02-17-1966 Married / Language: English / Race: White Male  History of Present Illness  The patient is a 50 year old male who presents with a parathyroid neoplasm.  Patient is referred by Dr. Wende Neighbors for surgical evaluation and management of primary hyperparathyroidism. Patient states that he has had elevated serum calcium levels dating back approximately 14 years. Patient has not had a prior workup. He was noted by his primary care physician have an elevated serum calcium level of 11.4. Patient underwent further laboratory testing with an intact PTH level of 75. Further diagnostic tests have not yet been performed. Patient denies any complications of hypercalcemia. He has had some intermittent episodes of weakness. He does have hypertension. He denies any history of nephrolithiasis. He has had no recent fractures. He has not had a bone density scan. There is no family history of parathyroid disease or other endocrine neoplasms. He is accompanied today by his daughter who has hypothyroidism.   Other Problems Anxiety Disorder High blood pressure  Past Surgical History  Vasectomy  Diagnostic Studies History Colonoscopy within last year  Allergies  No Known Drug Allergies08/30/2017  Medication History Lisinopril (10MG  Tablet, Oral) Active. Wellbutrin SR (100MG  Tablet ER 12HR, Oral) Active. Medications Reconciled  Social History  Alcohol use Occasional alcohol use. Caffeine use Coffee, Tea. No drug use Tobacco use Never smoker.  Family History  Heart Disease Father. Hypertension Mother. Thyroid problems Daughter.  Review of Systems General Present- Fatigue. Not Present- Appetite Loss, Chills, Fever, Night Sweats, Weight Gain and Weight Loss. Skin Not Present- Change in Wart/Mole, Dryness, Hives, Jaundice, New Lesions, Non-Healing Wounds, Rash and Ulcer. HEENT Present-  Seasonal Allergies, Sinus Pain and Wears glasses/contact lenses. Not Present- Earache, Hearing Loss, Hoarseness, Nose Bleed, Oral Ulcers, Ringing in the Ears, Sore Throat, Visual Disturbances and Yellow Eyes. Respiratory Not Present- Bloody sputum, Chronic Cough, Difficulty Breathing, Snoring and Wheezing. Breast Not Present- Breast Mass, Breast Pain, Nipple Discharge and Skin Changes. Cardiovascular Not Present- Chest Pain, Difficulty Breathing Lying Down, Leg Cramps, Palpitations, Rapid Heart Rate, Shortness of Breath and Swelling of Extremities. Gastrointestinal Not Present- Abdominal Pain, Bloating, Bloody Stool, Change in Bowel Habits, Chronic diarrhea, Constipation, Difficulty Swallowing, Excessive gas, Gets full quickly at meals, Hemorrhoids, Indigestion, Nausea, Rectal Pain and Vomiting. Male Genitourinary Not Present- Blood in Urine, Change in Urinary Stream, Frequency, Impotence, Nocturia, Painful Urination, Urgency and Urine Leakage. Musculoskeletal Present- Muscle Weakness. Not Present- Back Pain, Joint Pain, Joint Stiffness, Muscle Pain and Swelling of Extremities. Neurological Present- Tingling. Not Present- Decreased Memory, Fainting, Headaches, Numbness, Seizures, Tremor, Trouble walking and Weakness. Psychiatric Present- Anxiety. Not Present- Bipolar, Change in Sleep Pattern, Depression, Fearful and Frequent crying. Endocrine Not Present- Cold Intolerance, Excessive Hunger, Hair Changes, Heat Intolerance, Hot flashes and New Diabetes. Hematology Not Present- Blood Thinners, Easy Bruising, Excessive bleeding, Gland problems, HIV and Persistent Infections.  Vitals Weight: 183 lb Height: 67in Body Surface Area: 1.95 m Body Mass Index: 28.66 kg/m  Temp.: 59F(Temporal)  Pulse: 81 (Regular)  BP: 124/74 (Sitting, Left Arm, Standard)  Physical Exam The physical exam findings are as follows: Note:General - appears comfortable, no distress; not diaphorectic  HEENT -  normocephalic; sclerae clear, gaze conjugate; mucous membranes moist, dentition good; voice normal  Neck - symmetric on extension; no palpable anterior or posterior cervical adenopathy; no palpable masses in the thyroid bed  Chest - clear bilaterally without rhonchi, rales, or wheeze  Cor - regular rhythm  with normal rate; no significant murmur  Ext - non-tender without significant edema or lymphedema  Neuro - grossly intact; no tremor    Assessment & Plan  PRIMARY HYPERPARATHYROIDISM (E21.0)  Follow Up - Call [facility] office after tests / studies done to discuss further plans  Patient presents with hypercalcemia and an elevated intact PTH level. Patient likely has primary hyperparathyroidism. Written literature on parathyroid disease is provided to the patient for review at home.  Patient discussed primary hyperparathyroidism in detail. I recommended proceeding with additional testing including a 25-hydroxy vitamin D level, a 24-hour urine collection for calcium, and a nuclear medicine parathyroid scan. When the results of the studies are available, I will contact the patient.  We discussed the possibility of outpatient minimally invasive surgery. We will discuss this further once his test results are available.  ADDENDUM: Sestamibi scan was negative.  USN exam was negative.  4DCT indicates left parathyroid adenoma.  Plan to proceed with left parathyroidectomy.  The risks and benefits of the procedure have been discussed at length with the patient.  The patient understands the proposed procedure, potential alternative treatments, and the course of recovery to be expected.  All of the patient's questions have been answered at this time.  The patient wishes to proceed with surgery.  Earnstine Regal, MD, [facility] Office: [phone]

## 2016-07-23 ENCOUNTER — Inpatient Hospital Stay (HOSPITAL_COMMUNITY)
Admission: RE | Admit: 2016-07-23 | Discharge: 2016-07-24 | DRG: 627 | Disposition: A | Discharge status: Home / Self Care | Payer: Federal, State, Local not specified - PPO | Source: Ambulatory Visit | Attending: Surgery | Admitting: Surgery

## 2016-07-23 ENCOUNTER — Ambulatory Visit (HOSPITAL_COMMUNITY): Payer: Federal, State, Local not specified - PPO | Admitting: Anesthesiology

## 2016-07-23 ENCOUNTER — Encounter (HOSPITAL_COMMUNITY)
Admission: RE | Disposition: A | Discharge status: Home / Self Care | Payer: Self-pay | Source: Ambulatory Visit | Attending: Surgery

## 2016-07-23 ENCOUNTER — Encounter (HOSPITAL_COMMUNITY): Payer: Self-pay | Admitting: *Deleted

## 2016-07-23 DIAGNOSIS — E041 Nontoxic single thyroid nodule: Secondary | ICD-10-CM | POA: Diagnosis not present

## 2016-07-23 DIAGNOSIS — Z8249 Family history of ischemic heart disease and other diseases of the circulatory system: Secondary | ICD-10-CM | POA: Diagnosis not present

## 2016-07-23 DIAGNOSIS — D351 Benign neoplasm of parathyroid gland: Secondary | ICD-10-CM | POA: Diagnosis not present

## 2016-07-23 DIAGNOSIS — I10 Essential (primary) hypertension: Secondary | ICD-10-CM | POA: Diagnosis present

## 2016-07-23 DIAGNOSIS — T888 Other specified complications of surgical and medical care, not elsewhere classified: Secondary | ICD-10-CM | POA: Diagnosis not present

## 2016-07-23 DIAGNOSIS — E215 Disorder of parathyroid gland, unspecified: Secondary | ICD-10-CM | POA: Diagnosis present

## 2016-07-23 DIAGNOSIS — F419 Anxiety disorder, unspecified: Secondary | ICD-10-CM | POA: Diagnosis present

## 2016-07-23 DIAGNOSIS — Z79899 Other long term (current) drug therapy: Secondary | ICD-10-CM

## 2016-07-23 DIAGNOSIS — E210 Primary hyperparathyroidism: Secondary | ICD-10-CM | POA: Diagnosis not present

## 2016-07-23 DIAGNOSIS — K648 Other hemorrhoids: Secondary | ICD-10-CM | POA: Diagnosis not present

## 2016-07-23 DIAGNOSIS — R1012 Left upper quadrant pain: Secondary | ICD-10-CM | POA: Diagnosis not present

## 2016-07-23 HISTORY — PX: PARATHYROIDECTOMY: SHX19

## 2016-07-23 SURGERY — PARATHYROIDECTOMY
Anesthesia: General | Site: Neck | Laterality: Left

## 2016-07-23 MED ORDER — HYDROMORPHONE HCL 1 MG/ML IJ SOLN
INTRAMUSCULAR | Status: AC
  Filled 2016-07-23: qty 1

## 2016-07-23 MED ORDER — LIDOCAINE 2% (20 MG/ML) 5 ML SYRINGE
INTRAMUSCULAR | Status: DC | PRN
  Administered 2016-07-23: 100 mg via INTRAVENOUS

## 2016-07-23 MED ORDER — BUPIVACAINE HCL 0.25 % IJ SOLN
INTRAMUSCULAR | Status: DC | PRN
  Administered 2016-07-23: 10 mL

## 2016-07-23 MED ORDER — LIDOCAINE 2% (20 MG/ML) 5 ML SYRINGE
INTRAMUSCULAR | Status: AC
  Filled 2016-07-23: qty 5

## 2016-07-23 MED ORDER — ACETAMINOPHEN 650 MG RE SUPP: 650.0000 mg | Freq: Four times a day (QID) | RECTAL | Status: DC | PRN

## 2016-07-23 MED ORDER — LISINOPRIL 10 MG PO TABS
10.0000 mg | ORAL_TABLET | Freq: Every day | ORAL | Status: DC
  Administered 2016-07-23: 10 mg via ORAL
  Filled 2016-07-23: qty 1

## 2016-07-23 MED ORDER — FENTANYL CITRATE (PF) 100 MCG/2ML IJ SOLN
INTRAMUSCULAR | Status: DC | PRN
  Administered 2016-07-23 (×2): 50 ug via INTRAVENOUS
  Administered 2016-07-23: 100 ug via INTRAVENOUS
  Administered 2016-07-23: 50 ug via INTRAVENOUS

## 2016-07-23 MED ORDER — MIDAZOLAM HCL 2 MG/2ML IJ SOLN
INTRAMUSCULAR | Status: AC
  Filled 2016-07-23: qty 2

## 2016-07-23 MED ORDER — MIDAZOLAM HCL 2 MG/2ML IJ SOLN
0.5000 mg | Freq: Once | INTRAMUSCULAR | Status: AC | PRN
  Administered 2016-07-23: 1 mg via INTRAVENOUS

## 2016-07-23 MED ORDER — PHENYLEPHRINE HCL 10 MG/ML IJ SOLN
INTRAMUSCULAR | Status: DC | PRN
  Administered 2016-07-23: 80 ug via INTRAVENOUS

## 2016-07-23 MED ORDER — HYDROCODONE-ACETAMINOPHEN 5-325 MG PO TABS
1.0000 | ORAL_TABLET | ORAL | Status: DC | PRN
  Administered 2016-07-23 – 2016-07-24 (×2): 2 via ORAL
  Filled 2016-07-23 (×2): qty 2

## 2016-07-23 MED ORDER — HYDROMORPHONE HCL 1 MG/ML IJ SOLN: 1.0000 mg | INTRAMUSCULAR | Status: DC | PRN

## 2016-07-23 MED ORDER — 0.9 % SODIUM CHLORIDE (POUR BTL) OPTIME
TOPICAL | Status: DC | PRN
  Administered 2016-07-23: 1000 mL

## 2016-07-23 MED ORDER — MIDAZOLAM HCL 5 MG/5ML IJ SOLN
INTRAMUSCULAR | Status: DC | PRN
  Administered 2016-07-23 (×2): 1 mg via INTRAVENOUS

## 2016-07-23 MED ORDER — PROPOFOL 10 MG/ML IV BOLUS
INTRAVENOUS | Status: DC | PRN
  Administered 2016-07-23: 200 mg via INTRAVENOUS

## 2016-07-23 MED ORDER — SUCCINYLCHOLINE CHLORIDE 200 MG/10ML IV SOSY
PREFILLED_SYRINGE | INTRAVENOUS | Status: DC | PRN
  Administered 2016-07-23: 140 mg via INTRAVENOUS

## 2016-07-23 MED ORDER — LABETALOL HCL 5 MG/ML IV SOLN
5.0000 mg | INTRAVENOUS | Status: AC | PRN
  Administered 2016-07-23 (×4): 5 mg via INTRAVENOUS

## 2016-07-23 MED ORDER — FENTANYL CITRATE (PF) 100 MCG/2ML IJ SOLN
INTRAMUSCULAR | Status: AC
  Filled 2016-07-23: qty 2

## 2016-07-23 MED ORDER — DEXAMETHASONE SODIUM PHOSPHATE 10 MG/ML IJ SOLN
INTRAMUSCULAR | Status: AC
  Filled 2016-07-23: qty 1

## 2016-07-23 MED ORDER — ONDANSETRON HCL 4 MG/2ML IJ SOLN: 4.0000 mg | Freq: Four times a day (QID) | INTRAMUSCULAR | Status: DC | PRN

## 2016-07-23 MED ORDER — LABETALOL HCL 5 MG/ML IV SOLN
INTRAVENOUS | Status: AC
  Filled 2016-07-23: qty 4

## 2016-07-23 MED ORDER — LABETALOL HCL 5 MG/ML IV SOLN
5.0000 mg | INTRAVENOUS | Status: DC | PRN
  Administered 2016-07-23 (×2): 5 mg via INTRAVENOUS

## 2016-07-23 MED ORDER — ROCURONIUM BROMIDE 50 MG/5ML IV SOSY
PREFILLED_SYRINGE | INTRAVENOUS | Status: AC
  Filled 2016-07-23: qty 5

## 2016-07-23 MED ORDER — ONDANSETRON 4 MG PO TBDP: 4.0000 mg | ORAL_TABLET | Freq: Four times a day (QID) | ORAL | Status: DC | PRN

## 2016-07-23 MED ORDER — ONDANSETRON HCL 4 MG/2ML IJ SOLN
INTRAMUSCULAR | Status: DC | PRN
  Administered 2016-07-23: 4 mg via INTRAVENOUS

## 2016-07-23 MED ORDER — ACETAMINOPHEN 325 MG PO TABS: 650.0000 mg | ORAL_TABLET | Freq: Four times a day (QID) | ORAL | Status: DC | PRN

## 2016-07-23 MED ORDER — BUPIVACAINE HCL (PF) 0.25 % IJ SOLN
INTRAMUSCULAR | Status: AC
  Filled 2016-07-23: qty 30

## 2016-07-23 MED ORDER — CEFAZOLIN SODIUM-DEXTROSE 2-4 GM/100ML-% IV SOLN
2.0000 g | INTRAVENOUS | Status: AC
  Administered 2016-07-23: 2 g via INTRAVENOUS
  Filled 2016-07-23: qty 100

## 2016-07-23 MED ORDER — PHENYLEPHRINE 40 MCG/ML (10ML) SYRINGE FOR IV PUSH (FOR BLOOD PRESSURE SUPPORT)
PREFILLED_SYRINGE | INTRAVENOUS | Status: AC
  Filled 2016-07-23: qty 10

## 2016-07-23 MED ORDER — CEFAZOLIN SODIUM-DEXTROSE 2-4 GM/100ML-% IV SOLN
INTRAVENOUS | Status: AC
  Filled 2016-07-23: qty 100

## 2016-07-23 MED ORDER — DEXAMETHASONE SODIUM PHOSPHATE 10 MG/ML IJ SOLN
INTRAMUSCULAR | Status: DC | PRN
  Administered 2016-07-23: 10 mg via INTRAVENOUS

## 2016-07-23 MED ORDER — BUPROPION HCL ER (XL) 150 MG PO TB24
150.0000 mg | ORAL_TABLET | Freq: Every day | ORAL | Status: DC
  Filled 2016-07-23: qty 1

## 2016-07-23 MED ORDER — SUCCINYLCHOLINE CHLORIDE 20 MG/ML IJ SOLN
INTRAMUSCULAR | Status: AC
  Filled 2016-07-23: qty 1

## 2016-07-23 MED ORDER — MEPERIDINE HCL 50 MG/ML IJ SOLN: 6.2500 mg | INTRAMUSCULAR | Status: DC | PRN

## 2016-07-23 MED ORDER — HYDROMORPHONE HCL 1 MG/ML IJ SOLN
0.2500 mg | INTRAMUSCULAR | Status: DC | PRN
  Administered 2016-07-23 (×3): 0.5 mg via INTRAVENOUS

## 2016-07-23 MED ORDER — ROCURONIUM BROMIDE 10 MG/ML (PF) SYRINGE
PREFILLED_SYRINGE | INTRAVENOUS | Status: DC | PRN
  Administered 2016-07-23: 5 mg via INTRAVENOUS
  Administered 2016-07-23: 10 mg via INTRAVENOUS
  Administered 2016-07-23: 30 mg via INTRAVENOUS
  Administered 2016-07-23 (×2): 10 mg via INTRAVENOUS

## 2016-07-23 MED ORDER — PROPOFOL 10 MG/ML IV BOLUS
INTRAVENOUS | Status: AC
  Filled 2016-07-23: qty 20

## 2016-07-23 MED ORDER — KCL IN DEXTROSE-NACL 20-5-0.45 MEQ/L-%-% IV SOLN
INTRAVENOUS | Status: DC
  Administered 2016-07-23: 17:00:00 via INTRAVENOUS
  Filled 2016-07-23: qty 1000

## 2016-07-23 MED ORDER — PROMETHAZINE HCL 25 MG/ML IJ SOLN: 6.2500 mg | INTRAMUSCULAR | Status: DC | PRN

## 2016-07-23 MED ORDER — LACTATED RINGERS IV SOLN
INTRAVENOUS | Status: DC | PRN
  Administered 2016-07-23 (×2): via INTRAVENOUS

## 2016-07-23 MED ORDER — SUGAMMADEX SODIUM 200 MG/2ML IV SOLN
INTRAVENOUS | Status: DC | PRN
  Administered 2016-07-23: 200 mg via INTRAVENOUS

## 2016-07-23 MED ORDER — ONDANSETRON HCL 4 MG/2ML IJ SOLN
INTRAMUSCULAR | Status: AC
  Filled 2016-07-23: qty 2

## 2016-07-23 SURGICAL SUPPLY — 39 items
ATTRACTOMAT 16X20 MAGNETIC DRP (DRAPES) ×2 IMPLANT
BENZOIN TINCTURE PRP APPL 2/3 (GAUZE/BANDAGES/DRESSINGS) IMPLANT
BLADE HEX COATED 2.75 (ELECTRODE) ×2 IMPLANT
BLADE SURG 15 STRL LF DISP TIS (BLADE) ×1 IMPLANT
BLADE SURG 15 STRL SS (BLADE) ×1
CHLORAPREP W/TINT 26ML (MISCELLANEOUS) ×4 IMPLANT
CLIP TI MEDIUM 6 (CLIP) ×4 IMPLANT
CLIP TI WIDE RED SMALL 6 (CLIP) ×4 IMPLANT
COVER SURGICAL LIGHT HANDLE (MISCELLANEOUS) ×2 IMPLANT
DERMABOND ADVANCED (GAUZE/BANDAGES/DRESSINGS)
DERMABOND ADVANCED .7 DNX12 (GAUZE/BANDAGES/DRESSINGS) IMPLANT
DRAPE LAPAROTOMY T 98X78 PEDS (DRAPES) ×2 IMPLANT
ELECT PENCIL ROCKER SW 15FT (MISCELLANEOUS) ×2 IMPLANT
ELECT REM PT RETURN 9FT ADLT (ELECTROSURGICAL) ×2
ELECTRODE REM PT RTRN 9FT ADLT (ELECTROSURGICAL) ×1 IMPLANT
GAUZE SPONGE 4X4 12PLY STRL (GAUZE/BANDAGES/DRESSINGS) ×2 IMPLANT
GAUZE SPONGE 4X4 16PLY XRAY LF (GAUZE/BANDAGES/DRESSINGS) ×2 IMPLANT
GLOVE SURG ORTHO 8.0 STRL STRW (GLOVE) ×2 IMPLANT
GOWN STRL REUS W/TWL XL LVL3 (GOWN DISPOSABLE) ×6 IMPLANT
HEMOSTAT SURGICEL 2X4 FIBR (HEMOSTASIS) ×2 IMPLANT
ILLUMINATOR WAVEGUIDE N/F (MISCELLANEOUS) ×2 IMPLANT
KIT BASIN OR (CUSTOM PROCEDURE TRAY) ×2 IMPLANT
LIGHT WAVEGUIDE WIDE FLAT (MISCELLANEOUS) IMPLANT
NEEDLE HYPO 25X1 1.5 SAFETY (NEEDLE) ×2 IMPLANT
PACK BASIC VI WITH GOWN DISP (CUSTOM PROCEDURE TRAY) ×2 IMPLANT
STAPLER VISISTAT 35W (STAPLE) ×2 IMPLANT
STRIP CLOSURE SKIN 1/2X4 (GAUZE/BANDAGES/DRESSINGS) ×2 IMPLANT
SUT MNCRL AB 4-0 PS2 18 (SUTURE) ×2 IMPLANT
SUT SILK 2 0 (SUTURE)
SUT SILK 2-0 18XBRD TIE 12 (SUTURE) IMPLANT
SUT SILK 3 0 (SUTURE)
SUT SILK 3-0 18XBRD TIE 12 (SUTURE) IMPLANT
SUT VIC AB 3-0 SH 18 (SUTURE) ×4 IMPLANT
SYR BULB IRRIGATION 50ML (SYRINGE) ×2 IMPLANT
SYR CONTROL 10ML LL (SYRINGE) ×2 IMPLANT
TAPE CLOTH SURG 4X10 WHT LF (GAUZE/BANDAGES/DRESSINGS) ×2 IMPLANT
TOWEL OR 17X26 10 PK STRL BLUE (TOWEL DISPOSABLE) ×2 IMPLANT
TOWEL OR NON WOVEN STRL DISP B (DISPOSABLE) ×2 IMPLANT
YANKAUER SUCT BULB TIP 10FT TU (MISCELLANEOUS) ×2 IMPLANT

## 2016-07-23 NOTE — Brief Op Note (Signed)
07/23/2016  1:17 PM  PATIENT:  Camelia Phenes  50 y.o. male  PRE-OPERATIVE DIAGNOSIS:  primary hyperparathyroidism  POST-OPERATIVE DIAGNOSIS:  primary hyperparathyroidism  PROCEDURE:  1. Neck exploration  2. Right inferior parathyroidectomy  SURGEON:  Surgeon(s) and Role:    * Armandina Gemma, MD - Primary  PHYSICIAN ASSISTANT:   ASSISTANTS: none   ANESTHESIA:   general  EBL:  Total I/O In: 1000 [I.V.:1000] Out: -   BLOOD ADMINISTERED:none  DRAINS: none   LOCAL MEDICATIONS USED:  MARCAINE     SPECIMEN:  Excision  DISPOSITION OF SPECIMEN:  PATHOLOGY  COUNTS:  YES  TOURNIQUET:  * No tourniquets in log *  DICTATION: .Other Dictation: Dictation Number [id number]  PLAN OF CARE: Admit for overnight observation  PATIENT DISPOSITION:  PACU - hemodynamically stable.   Delay start of Pharmacological VTE agent (>24hrs) due to surgical blood loss or risk of bleeding: yes  Earnstine Regal, MD, [facility] Office: [phone]

## 2016-07-23 NOTE — Progress Notes (Signed)
Labetalol repeated.

## 2016-07-23 NOTE — Anesthesia Postprocedure Evaluation (Signed)
Anesthesia Post Note  Patient: Jordan Reid  Procedure(s) Performed: Procedure(s) (LRB): LEFT PARATHYROIDECTOMY (Left)  Patient location during evaluation: PACU Anesthesia Type: General Level of consciousness: awake and alert, oriented and patient cooperative Pain management: pain level controlled Vital Signs Assessment: post-procedure vital signs reviewed and stable Respiratory status: spontaneous breathing, nonlabored ventilation and respiratory function stable Cardiovascular status: blood pressure returned to baseline and stable Postop Assessment: no signs of nausea or vomiting Anesthetic complications: no    Last Vitals:  Vitals:   07/23/16 1602 07/23/16 1621  BP: (!) 146/87   Pulse: (!) 105   Resp: 15   Temp: 36.7 C 37.3 C    Last Pain:  Vitals:   07/23/16 1621  TempSrc: Oral  PainSc:                  Tashunda Vandezande,E. Lillian Tigges

## 2016-07-23 NOTE — Anesthesia Procedure Notes (Addendum)
Procedure Name: Intubation Performed by: Lind Covert Pre-anesthesia Checklist: Patient identified, Emergency Drugs available, Suction available, Patient being monitored and Timeout performed Patient Re-evaluated:Patient Re-evaluated prior to inductionOxygen Delivery Method: Circle system utilized Preoxygenation: Pre-oxygenation with 100% oxygen Intubation Type: IV induction Laryngoscope Size: Miller and 2 Grade View: Grade I Tube type: Oral Tube size: 7.5 mm Number of attempts: 1 Airway Equipment and Method: Stylet Placement Confirmation: ETT inserted through vocal cords under direct vision,  positive ETCO2 and breath sounds checked- equal and bilateral Secured at: 23 cm Tube secured with: Tape Dental Injury: Teeth and Oropharynx as per pre-operative assessment

## 2016-07-23 NOTE — Transfer of Care (Signed)
Immediate Anesthesia Transfer of Care Note  Patient: Jordan Reid  Procedure(s) Performed: Procedure(s): LEFT PARATHYROIDECTOMY (Left)  Patient Location: PACU  Anesthesia Type:General  Level of Consciousness: sedated  Airway & Oxygen Therapy: Patient Spontanous Breathing and Patient connected to face mask oxygen  Post-op Assessment: Report given to RN and Post -op Vital signs reviewed and stable  Post vital signs: Reviewed and stable  Last Vitals:  Vitals:   07/23/16 0909  BP: (!) 157/96  Pulse: 95  Resp: 16  Temp: 37.2 C    Last Pain:  Vitals:   07/23/16 0909  TempSrc: Oral         Complications: No apparent anesthesia complications

## 2016-07-23 NOTE — Progress Notes (Signed)
Dr. Annye Asa in to see patient- orders given.

## 2016-07-23 NOTE — Interval H&P Note (Signed)
History and Physical Interval Note:  07/23/2016 10:53 AM  Jordan Reid  has presented today for surgery, with the diagnosis of primary hyperparathyroidism.  The various methods of treatment have been discussed with the patient and family. After consideration of risks, benefits and other options for treatment, the patient has consented to    Procedure(s): LEFT PARATHYROIDECTOMY (Left) as a surgical intervention .    The patient's history has been reviewed, patient examined, no change in status, stable for surgery.  I have reviewed the patient's chart and labs.  Questions were answered to the patient's satisfaction.    Earnstine Regal, MD, [facility] Office: [place]

## 2016-07-23 NOTE — Progress Notes (Signed)
Dr. Harlow Asa  In to see patient- made aware of patient's vital signs

## 2016-07-23 NOTE — Anesthesia Preprocedure Evaluation (Addendum)
Anesthesia Evaluation  Patient identified by MRN, date of birth, ID band Patient awake    Reviewed: Allergy & Precautions, NPO status , Patient's Chart, lab work & pertinent test results  History of Anesthesia Complications Negative for: history of anesthetic complications  Airway Mallampati: II  TM Distance: >3 FB Neck ROM: Full    Dental  (+) Dental Advisory Given   Pulmonary neg pulmonary ROS,    breath sounds clear to auscultation       Cardiovascular hypertension, Pt. on medications (-) angina Rhythm:Regular Rate:Normal     Neuro/Psych Anxiety negative neurological ROS     GI/Hepatic negative GI ROS, Neg liver ROS,   Endo/Other  Primary hyperparathyroidism  Renal/GU negative Renal ROS     Musculoskeletal   Abdominal   Peds  Hematology negative hematology ROS (+)   Anesthesia Other Findings   Reproductive/Obstetrics                            Anesthesia Physical Anesthesia Plan  ASA: II  Anesthesia Plan: General   Post-op Pain Management:    Induction: Intravenous  Airway Management Planned: Oral ETT  Additional Equipment:   Intra-op Plan:   Post-operative Plan: Extubation in OR  Informed Consent: I have reviewed the patients History and Physical, chart, labs and discussed the procedure including the risks, benefits and alternatives for the proposed anesthesia with the patient or authorized representative who has indicated his/her understanding and acceptance.   Dental advisory given  Plan Discussed with: CRNA and Surgeon  Anesthesia Plan Comments: (Plan routine monitors, GETA)        Anesthesia Quick Evaluation

## 2016-07-23 NOTE — Progress Notes (Signed)
Labetalol begun for elevated heart rates and blood pressure after making Dr. Annye Asa aware of patient's vital signs

## 2016-07-23 NOTE — Progress Notes (Signed)
Labetalol repeated

## 2016-07-24 LAB — BASIC METABOLIC PANEL
Anion gap: 10 (ref 5–15)
BUN: 14 mg/dL (ref 6–20)
CHLORIDE: 103 mmol/L (ref 101–111)
CO2: 26 mmol/L (ref 22–32)
Calcium: 8.7 mg/dL — ABNORMAL LOW (ref 8.9–10.3)
Creatinine, Ser: 0.76 mg/dL (ref 0.61–1.24)
GFR calc non Af Amer: >60 mL/min (ref >60)
Glucose, Bld: 146 mg/dL — ABNORMAL HIGH (ref 65–99)
POTASSIUM: 3.9 mmol/L (ref 3.5–5.1)
SODIUM: 139 mmol/L (ref 135–145)

## 2016-07-24 MED ORDER — HYDROCODONE-ACETAMINOPHEN 5-325 MG PO TABS: 1.0000 | ORAL_TABLET | ORAL | 0 refills | Status: DC | PRN

## 2016-07-24 NOTE — Op Note (Signed)
NAMETAMAJ, DAMBROSI NO.:  [id number]  MEDICAL RECORD NO.:  [id number]  LOCATION:  [id number]                         FACILITY:  [facility]  PHYSICIAN:  Earnstine Regal, MD      DATE OF BIRTH:  1966/01/27  DATE OF PROCEDURE:  07/23/2016                              OPERATIVE REPORT   PREOPERATIVE DIAGNOSIS:  Primary hyperparathyroidism.  POSTOPERATIVE DIAGNOSIS:  Primary hyperparathyroidism.  PROCEDURES: 1. Neck exploration. 2. Right inferior parathyroidectomy.  SURGEON:  Earnstine Regal, MD  ANESTHESIA:  General.  ESTIMATED BLOOD LOSS:  Minimal.  PREPARATION:  ChloraPrep.  COMPLICATIONS:  None.  INDICATIONS:  The patient is a 50 year old white male, referred by Dr. Delphina Cahill for evaluation and surgical management of primary hyperparathyroidism.  The patient has elevated serum calcium levels dating back 14 years.  Laboratory studies showed an elevated calcium level of 11.4, and an elevated intact PTH level of 75.  The patient has not had any complications.  The patient underwent nuclear medicine parathyroid scan, which was negative.  The patient underwent ultrasound examination of the neck, which was negative.  The patient underwent 4D CT scan of the neck, which suggested a left parathyroid adenoma.  The patient now comes to Surgery for neck exploration and parathyroidectomy.  BODY OF REPORT:  Procedure was done in OR #4 at the [facility].  The patient was brought to the operating room and placed in supine position on the operating room table.  Following administration of general anesthesia, the patient was positioned and then prepped and draped in the usual aseptic fashion.  After ascertaining that an adequate level of anesthesia had been achieved, a left anterior neck incision was made with a #15 blade.  Dissection was carried through the subcutaneous tissues and platysma.  Hemostasis was achieved with the electrocautery.  Skin flaps were  elevated cephalad and caudad, and a Weitlaner retractor placed for exposure.  Strap muscles were incised in the midline and reflected to the left exposing the left thyroid lobe.  Left lobe was gently mobilized.  Venous tributaries were divided between Ligaclips.  Exploration revealed a normal left superior parathyroid gland.  The nodule seen on CT scan was present off the inferior pole of the left thyroid lobe.  This appeared to be a thyroid nodule.  It was gently resected and submitted to Pathology for review. The pathologist stated that the nodule was somewhat cystic.  It was likely a thyroid nodule.  There was no sign of parathyroid tissue.  Further exploration on the left along the tracheoesophageal groove, and the carotid sheath, and in the thyrothymic tract revealed no evidence of parathyroid adenoma.  Decision was made to proceed with exploration of the right side. Surgical incision was extended to the right.  Skin flaps were elevated. A Mahorner self-retaining retractor was placed for exposure.  Strap muscles were incised inferiorly and superiorly in the midline allowing for complete mobilization.  Strap muscles were reflected to the right exposing the right thyroid lobe.  Right lobe was then gently mobilized laterally.  Exploration along the superior pole revealed a normal superior parathyroid gland.  Exploration along the lateral and inferior portion of the  thyroid gland revealed what appears to be an enlarged parathyroid gland measuring approximately 1.5 cm in length.  It was gently dissected off the capsule of the thyroid and excised.  It was submitted to Pathology where frozen section confirmed hypercellular parathyroid tissue, which was consistent with parathyroid adenoma.  The neck was irrigated with warm saline.  Good hemostasis was achieved. Fibrillar was placed throughout the operative field.  Strap muscles were reapproximated in the midline with interrupted 3-0  Vicryl sutures. Platysma was closed with interrupted 3-0 Vicryl sutures.  Skin was closed with a running 4-0 Monocryl subcuticular suture.  Skin was anesthetized with local anesthetic.  Wound was washed and dried.  Steri- Strips were applied to the incision.  Sterile gauze dressings were applied to the incision.  The patient was awakened from anesthesia and brought to the recovery room.  The patient tolerated the procedure well.  Earnstine Regal, MD, [facility] Office: [phone]   TMG/MEDQ  D:  07/23/2016  T:  07/24/2016  Job:  [id number]  cc:   Delphina Cahill, M.D. Fax: [phone]

## 2016-07-24 NOTE — Discharge Summary (Signed)
Physician Discharge Summary [facility]  Patient ID: Jordan Reid MRN: [id number] DOB/AGE: June 09, 1966 50 y.o.  Admit date: 07/23/2016 Discharge date: 07/24/2016  Admission Diagnoses:  Primary hyperparathyroidism  Discharge Diagnoses:  Principal Problem:   Hyperparathyroidism, primary ([place]) Active Problems:   Parathyroid abnormality [facility])   Discharged Condition: good  Hospital Course: Patient was admitted for observation following parathyroid surgery.  Post op course was uncomplicated.  Pain was well controlled.  Tolerated diet.  Post op calcium level on morning following surgery was 8.7 mg/dl.  Patient was prepared for discharge home on POD#1.  Consults: None  Treatments: surgery: neck exploration and parathyroidectomy  Discharge Exam: Blood pressure (!) 129/95, pulse (!) 104, temperature 98.1 F (36.7 C), temperature source Oral, resp. rate 18, height 5\' 7"  (1.702 Reid), weight 78.9 kg (174 lb), SpO2 99 %. HEENT - clear Neck - wound dry and intact, voice normal, mild STS  Disposition: Home  Discharge Instructions    Apply dressing    Complete by:  As directed    Apply light gauze dressing to wound before discharge home today.   Diet - low sodium heart healthy    Complete by:  As directed    Discharge instructions    Complete by:  As directed    [facility]  THYROID & PARATHYROID SURGERY:  POST-OP INSTRUCTIONS  Always review your discharge instruction sheet from the facility where your surgery was performed.  A prescription for pain medication may be given to you upon discharge.  Take your pain medication as prescribed.  If narcotic pain medicine is not needed, then you may take acetaminophen (Tylenol) or ibuprofen (Advil) as needed.  Take your usually prescribed medications unless otherwise directed.  If you need a refill on your pain medication, please contact your pharmacy. They will contact our office to request  authorization.  Prescriptions will not be processed by our office after 5 pm or on weekends.  Start with a light diet upon arrival home, such as soup and crackers or toast.  Be sure to drink plenty of fluids daily.  Resume your normal diet the day after surgery.  Most patients will experience some swelling and bruising on the chest and neck area.  Ice packs will help.  Swelling and bruising can take several days to resolve.   It is common to experience some constipation after surgery.  Increasing fluid intake and taking a stool softener will usually help or prevent this problem.  A mild laxative (Milk of Magnesia or Miralax) should be taken according to package directions if there has been no bowel movement after 48 hours.  You have steri-strips and a gauze dressing over your incision.  You may remove the gauze bandage on the second day after surgery, and you may shower at that time.  Leave your steri-strips (small skin tapes) in place directly over the incision.  These strips should remain on the skin for 5-7 days and then be removed.  You may get them wet in the shower and pat them dry.  You may resume regular (light) daily activities beginning the next day - such as daily self-care, walking, climbing stairs - gradually increasing activities as tolerated.  You may have sexual intercourse when it is comfortable.  Refrain from any heavy lifting or straining until approved by your doctor.  You may drive when you no longer are taking prescription pain medication, you can comfortably wear a seatbelt, and you can safely maneuver your car  and apply brakes.  You should see your doctor in the office for a follow-up appointment approximately two to three weeks after your surgery.  Make sure that you call for this appointment within a day or two after you arrive home to insure a convenient appointment time.  WHEN TO CALL YOUR DOCTOR: -- Fever greater than 101.5 -- Inability to urinate -- Nausea and/or  vomiting - persistent -- Extreme swelling or bruising -- Continued bleeding from incision -- Increased pain, redness, or drainage from the incision -- Difficulty swallowing or breathing -- Muscle cramping or spasms -- Numbness or tingling in hands or around lips  The clinic staff is available to answer your questions during regular business hours.  Please don't hesitate to call and ask to speak to one of the nurses if you have concerns.  Earnstine Regal, MD, [facility] Office: [phone]  Website: [email or web address]   Increase activity slowly    Complete by:  As directed    No dressing needed    Complete by:  As directed        Medication List    TAKE these medications   buPROPion 150 MG 24 hr tablet Commonly known as:  WELLBUTRIN XL Take 150 mg by mouth daily.   HYDROcodone-acetaminophen 5-325 MG tablet Commonly known as:  NORCO/VICODIN Take 1-2 tablets by mouth every 4 (four) hours as needed for moderate pain.   lisinopril 10 MG tablet Commonly known as:  PRINIVIL,ZESTRIL Take 10 mg by mouth daily.      Follow-up Information    Jordan Streiff M, MD. Schedule an appointment as soon as possible for a visit in 3 week(s).   Specialty:  General Surgery Contact information: [address] [place] [address] [phone]           Earnstine Regal, MD, [facility] Office: [phone]   Signed: Earnstine Regal 07/24/2016, 8:04 AM

## 2016-07-30 DIAGNOSIS — K080 Exfoliation of teeth due to systemic causes: Secondary | ICD-10-CM | POA: Diagnosis not present

## 2016-08-17 DIAGNOSIS — E210 Primary hyperparathyroidism: Secondary | ICD-10-CM | POA: Diagnosis not present

## 2016-10-31 DIAGNOSIS — I10 Essential (primary) hypertension: Secondary | ICD-10-CM | POA: Diagnosis not present

## 2016-10-31 DIAGNOSIS — E782 Mixed hyperlipidemia: Secondary | ICD-10-CM | POA: Diagnosis not present

## 2016-11-02 DIAGNOSIS — F411 Generalized anxiety disorder: Secondary | ICD-10-CM | POA: Diagnosis not present

## 2016-11-02 DIAGNOSIS — I10 Essential (primary) hypertension: Secondary | ICD-10-CM | POA: Diagnosis not present

## 2016-11-02 DIAGNOSIS — E782 Mixed hyperlipidemia: Secondary | ICD-10-CM | POA: Diagnosis not present

## 2016-12-17 DIAGNOSIS — H18601 Keratoconus, unspecified, right eye: Secondary | ICD-10-CM | POA: Diagnosis not present

## 2016-12-17 DIAGNOSIS — H25013 Cortical age-related cataract, bilateral: Secondary | ICD-10-CM | POA: Diagnosis not present

## 2016-12-17 DIAGNOSIS — H2513 Age-related nuclear cataract, bilateral: Secondary | ICD-10-CM | POA: Diagnosis not present

## 2016-12-17 DIAGNOSIS — H40013 Open angle with borderline findings, low risk, bilateral: Secondary | ICD-10-CM | POA: Diagnosis not present

## 2017-02-05 DIAGNOSIS — K080 Exfoliation of teeth due to systemic causes: Secondary | ICD-10-CM | POA: Diagnosis not present

## 2017-04-01 DIAGNOSIS — K080 Exfoliation of teeth due to systemic causes: Secondary | ICD-10-CM | POA: Diagnosis not present

## 2017-05-15 DIAGNOSIS — I10 Essential (primary) hypertension: Secondary | ICD-10-CM | POA: Diagnosis not present

## 2017-05-17 DIAGNOSIS — I10 Essential (primary) hypertension: Secondary | ICD-10-CM | POA: Diagnosis not present

## 2017-05-17 DIAGNOSIS — E782 Mixed hyperlipidemia: Secondary | ICD-10-CM | POA: Diagnosis not present

## 2017-05-17 DIAGNOSIS — H40059 Ocular hypertension, unspecified eye: Secondary | ICD-10-CM | POA: Diagnosis not present

## 2017-06-10 DIAGNOSIS — H18601 Keratoconus, unspecified, right eye: Secondary | ICD-10-CM | POA: Diagnosis not present

## 2017-06-10 DIAGNOSIS — H40013 Open angle with borderline findings, low risk, bilateral: Secondary | ICD-10-CM | POA: Diagnosis not present

## 2017-06-10 DIAGNOSIS — H40011 Open angle with borderline findings, low risk, right eye: Secondary | ICD-10-CM | POA: Diagnosis not present

## 2017-06-10 DIAGNOSIS — H40012 Open angle with borderline findings, low risk, left eye: Secondary | ICD-10-CM | POA: Diagnosis not present

## 2017-08-16 DIAGNOSIS — J039 Acute tonsillitis, unspecified: Secondary | ICD-10-CM | POA: Diagnosis not present

## 2017-08-16 DIAGNOSIS — K625 Hemorrhage of anus and rectum: Secondary | ICD-10-CM | POA: Diagnosis not present

## 2017-08-16 DIAGNOSIS — J029 Acute pharyngitis, unspecified: Secondary | ICD-10-CM | POA: Diagnosis not present

## 2017-08-16 DIAGNOSIS — K219 Gastro-esophageal reflux disease without esophagitis: Secondary | ICD-10-CM | POA: Diagnosis not present

## 2017-08-20 DIAGNOSIS — K080 Exfoliation of teeth due to systemic causes: Secondary | ICD-10-CM | POA: Diagnosis not present

## 2017-10-30 IMAGING — CT CT NECK SOFT TISSUE WO/W CM
3 of 10 series · 5 of 20 positions shown, 6 images · IV contrast (75CC ISOVUE 300)
Comparison: parathyroid ultrasound 06/08/2016, nuclear medicine
parathyroid scan 05/24/2016

CLINICAL DATA: Hyperparathyroidism, elevated calcium

EXAM:
CT NECK WITH AND WITHOUT CONTRAST
TECHNIQUE: Multidetector CT imaging of the neck was performed without and with
intravenous contrast. A 4 dimensional parathyroid protocol was
utilized, imaging the neck in multiple phases following contrast
administration.
CONTRAST:  75mL OCH291-XMM IOPAMIDOL (OCH291-XMM) INJECTION 61%

[Series 4: arterial thin · axial · arterial · 0.46mm/px · z∈[-38,+40]mm · 2 of 380 slices shown, 3 images]
[im 127/380  soft-tissue]
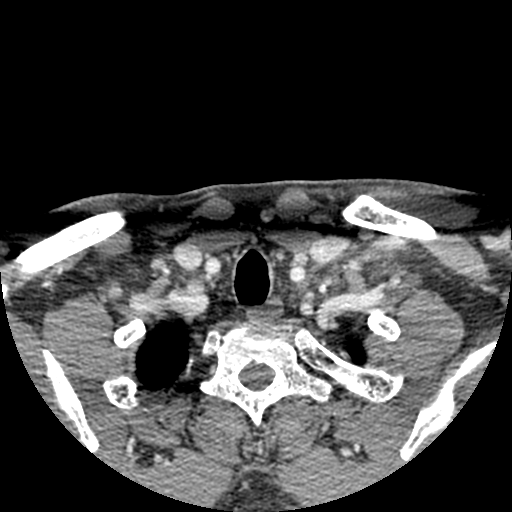
[im 127/380  bone]
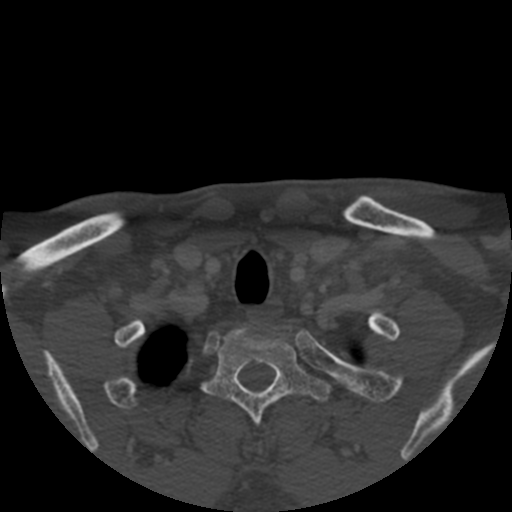
[im 253/380  bone]
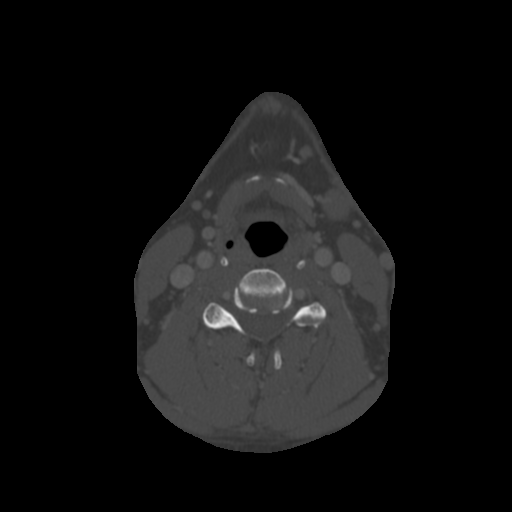

[Series 5: venous thin · axial · portal-venous · 0.46mm/px · z∈[-38,+40]mm · 2 of 380 slices shown]
[im 127/380  bone]
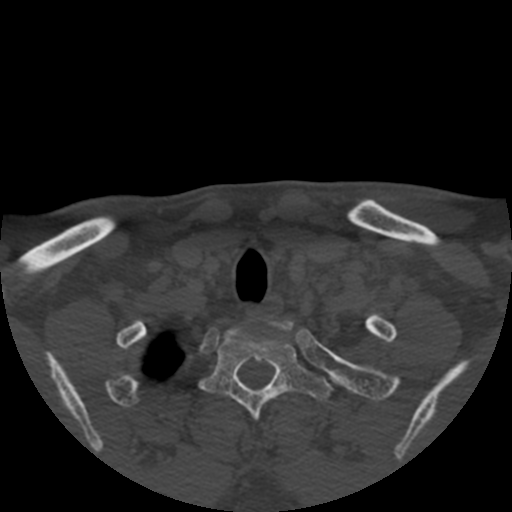
[im 253/380  bone]
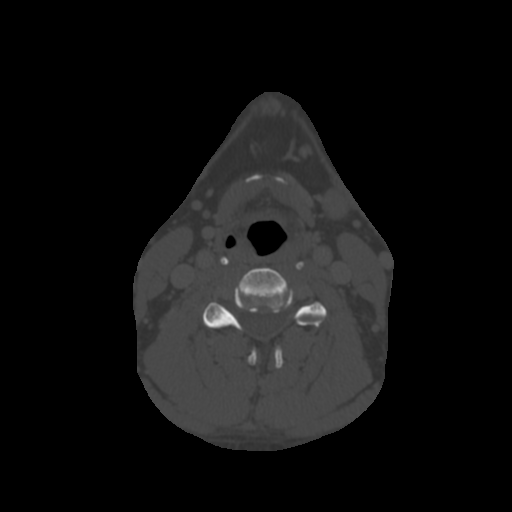

[Series 601: cor soft tissue neck · coronal · 0.46mm/px · 1 of 118 slices shown]
[im 59/118  bone]
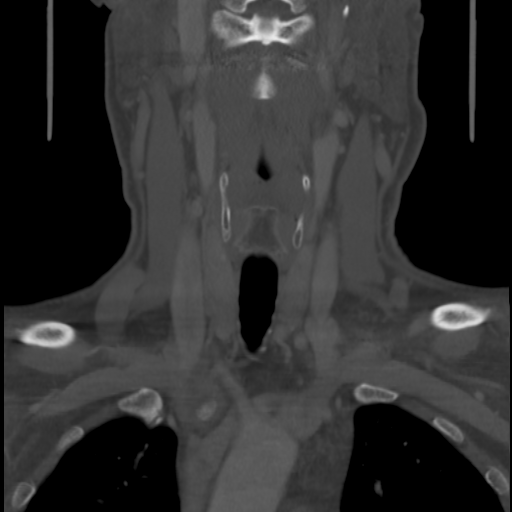

[5 of 20 positions shown; findings below may reference images not displayed]

FINDINGS: Thyroid and parathyroid: There is a candidate lesion for parathyroid
adenoma chest posterior to the left thyroid lobe, below the superior
aspect of the thyroid isthmus and at the level of the
tracheoesophageal groove. The lesion measures 6 x 6 x 7 mm and is
best visualized on series 4, image 157 and series 601, image 67. No
polar vessel is identified. The lesion shows earlier washout of
contrast than the adjacent thyroid gland and is hypodense relative
to the thyroid on noncontrast images.

Pharynx and larynx: The nasopharynx is clear. The oropharynx and
hypopharynx are normal. The epiglottis is normal. The supraglottic
larynx, glottis and subglottic larynx are normal. No retropharyngeal
collection. The parapharyngeal spaces are preserved.

Salivary glands: The parotid and submandibular glands are normal. No
sialolithiasis or salivary ductal dilatation.

Lymph nodes: No enlarged or abnormal appearing cervical lymph nodes.

Vascular: Major cervical vessels are normal.

Mastoids and visualized paranasal sinuses: Clear

Skeleton: No bony spinal canal stenosis. No lytic or blastic
lesions.

Upper chest: Clear

Other: None
IMPRESSION: Lesion consistent with parathyroid adenoma measuring 6 x 6 x 7 mm,
posterior to and in contact with the left thyroid lobe.

## 2017-11-11 DIAGNOSIS — I10 Essential (primary) hypertension: Secondary | ICD-10-CM | POA: Diagnosis not present

## 2017-11-11 DIAGNOSIS — E782 Mixed hyperlipidemia: Secondary | ICD-10-CM | POA: Diagnosis not present

## 2017-11-13 DIAGNOSIS — Z0000 Encounter for general adult medical examination without abnormal findings: Secondary | ICD-10-CM | POA: Diagnosis not present

## 2017-11-27 DIAGNOSIS — I10 Essential (primary) hypertension: Secondary | ICD-10-CM | POA: Diagnosis not present

## 2017-11-27 DIAGNOSIS — Z6830 Body mass index (BMI) 30.0-30.9, adult: Secondary | ICD-10-CM | POA: Diagnosis not present

## 2017-12-18 DIAGNOSIS — H40013 Open angle with borderline findings, low risk, bilateral: Secondary | ICD-10-CM | POA: Diagnosis not present

## 2018-01-20 DIAGNOSIS — K080 Exfoliation of teeth due to systemic causes: Secondary | ICD-10-CM | POA: Diagnosis not present

## 2018-01-28 DIAGNOSIS — K080 Exfoliation of teeth due to systemic causes: Secondary | ICD-10-CM | POA: Diagnosis not present

## 2018-03-12 DIAGNOSIS — K080 Exfoliation of teeth due to systemic causes: Secondary | ICD-10-CM | POA: Diagnosis not present

## 2018-04-07 DIAGNOSIS — H5213 Myopia, bilateral: Secondary | ICD-10-CM | POA: Diagnosis not present

## 2018-04-07 DIAGNOSIS — H2513 Age-related nuclear cataract, bilateral: Secondary | ICD-10-CM | POA: Diagnosis not present

## 2018-04-07 DIAGNOSIS — H02831 Dermatochalasis of right upper eyelid: Secondary | ICD-10-CM | POA: Diagnosis not present

## 2018-04-07 DIAGNOSIS — H5989 Other postprocedural complications and disorders of eye and adnexa, not elsewhere classified: Secondary | ICD-10-CM | POA: Diagnosis not present

## 2018-05-29 DIAGNOSIS — Z6830 Body mass index (BMI) 30.0-30.9, adult: Secondary | ICD-10-CM | POA: Diagnosis not present

## 2018-05-29 DIAGNOSIS — I10 Essential (primary) hypertension: Secondary | ICD-10-CM | POA: Diagnosis not present

## 2018-05-29 DIAGNOSIS — Z0000 Encounter for general adult medical examination without abnormal findings: Secondary | ICD-10-CM | POA: Diagnosis not present

## 2018-06-02 DIAGNOSIS — I10 Essential (primary) hypertension: Secondary | ICD-10-CM | POA: Diagnosis not present

## 2018-06-02 DIAGNOSIS — E785 Hyperlipidemia, unspecified: Secondary | ICD-10-CM | POA: Diagnosis not present

## 2018-06-02 DIAGNOSIS — M25562 Pain in left knee: Secondary | ICD-10-CM | POA: Diagnosis not present

## 2018-06-05 DIAGNOSIS — S8002XA Contusion of left knee, initial encounter: Secondary | ICD-10-CM | POA: Diagnosis not present

## 2018-06-05 DIAGNOSIS — M25562 Pain in left knee: Secondary | ICD-10-CM | POA: Diagnosis not present

## 2018-07-08 DIAGNOSIS — E782 Mixed hyperlipidemia: Secondary | ICD-10-CM | POA: Diagnosis not present

## 2018-07-08 DIAGNOSIS — Z6829 Body mass index (BMI) 29.0-29.9, adult: Secondary | ICD-10-CM | POA: Diagnosis not present

## 2018-07-08 DIAGNOSIS — I10 Essential (primary) hypertension: Secondary | ICD-10-CM | POA: Diagnosis not present

## 2018-07-31 DIAGNOSIS — M79602 Pain in left arm: Secondary | ICD-10-CM | POA: Diagnosis not present

## 2018-07-31 DIAGNOSIS — S40022A Contusion of left upper arm, initial encounter: Secondary | ICD-10-CM | POA: Diagnosis not present

## 2018-08-19 DIAGNOSIS — S40022D Contusion of left upper arm, subsequent encounter: Secondary | ICD-10-CM | POA: Diagnosis not present

## 2018-08-19 DIAGNOSIS — M25522 Pain in left elbow: Secondary | ICD-10-CM | POA: Diagnosis not present

## 2018-09-02 DIAGNOSIS — M25522 Pain in left elbow: Secondary | ICD-10-CM | POA: Diagnosis not present

## 2018-09-02 DIAGNOSIS — S40022D Contusion of left upper arm, subsequent encounter: Secondary | ICD-10-CM | POA: Diagnosis not present

## 2018-09-24 DIAGNOSIS — M542 Cervicalgia: Secondary | ICD-10-CM | POA: Diagnosis not present

## 2018-09-26 DIAGNOSIS — M542 Cervicalgia: Secondary | ICD-10-CM | POA: Diagnosis not present

## 2018-10-03 DIAGNOSIS — M542 Cervicalgia: Secondary | ICD-10-CM | POA: Diagnosis not present

## 2018-10-08 DIAGNOSIS — M542 Cervicalgia: Secondary | ICD-10-CM | POA: Diagnosis not present

## 2018-10-08 DIAGNOSIS — M25522 Pain in left elbow: Secondary | ICD-10-CM | POA: Diagnosis not present

## 2018-10-08 DIAGNOSIS — S40022D Contusion of left upper arm, subsequent encounter: Secondary | ICD-10-CM | POA: Diagnosis not present

## 2018-11-05 DIAGNOSIS — K080 Exfoliation of teeth due to systemic causes: Secondary | ICD-10-CM | POA: Diagnosis not present

## 2018-11-20 DIAGNOSIS — I10 Essential (primary) hypertension: Secondary | ICD-10-CM | POA: Diagnosis not present

## 2018-11-20 DIAGNOSIS — E782 Mixed hyperlipidemia: Secondary | ICD-10-CM | POA: Diagnosis not present

## 2018-11-20 DIAGNOSIS — E785 Hyperlipidemia, unspecified: Secondary | ICD-10-CM | POA: Diagnosis not present

## 2018-11-20 DIAGNOSIS — Z6831 Body mass index (BMI) 31.0-31.9, adult: Secondary | ICD-10-CM | POA: Diagnosis not present

## 2018-11-25 DIAGNOSIS — I10 Essential (primary) hypertension: Secondary | ICD-10-CM | POA: Diagnosis not present

## 2018-11-25 DIAGNOSIS — E663 Overweight: Secondary | ICD-10-CM | POA: Diagnosis not present

## 2018-11-25 DIAGNOSIS — Z0000 Encounter for general adult medical examination without abnormal findings: Secondary | ICD-10-CM | POA: Diagnosis not present

## 2018-11-25 DIAGNOSIS — E782 Mixed hyperlipidemia: Secondary | ICD-10-CM | POA: Diagnosis not present

## 2019-02-25 ENCOUNTER — Other Ambulatory Visit: Payer: Federal, State, Local not specified - PPO

## 2019-02-25 ENCOUNTER — Other Ambulatory Visit: Payer: Self-pay

## 2019-02-25 DIAGNOSIS — R6889 Other general symptoms and signs: Secondary | ICD-10-CM | POA: Diagnosis not present

## 2019-02-25 DIAGNOSIS — Z20822 Contact with and (suspected) exposure to covid-19: Secondary | ICD-10-CM

## 2019-03-03 LAB — NOVEL CORONAVIRUS, NAA: SARS-CoV-2, NAA: NOT DETECTED

## 2019-03-04 ENCOUNTER — Telehealth: Payer: Self-pay | Admitting: Internal Medicine

## 2019-03-04 NOTE — Telephone Encounter (Signed)
Pt is aware covid 19 results negative °

## 2019-04-16 DIAGNOSIS — H25013 Cortical age-related cataract, bilateral: Secondary | ICD-10-CM | POA: Diagnosis not present

## 2019-04-16 DIAGNOSIS — H18601 Keratoconus, unspecified, right eye: Secondary | ICD-10-CM | POA: Diagnosis not present

## 2019-04-16 DIAGNOSIS — H40013 Open angle with borderline findings, low risk, bilateral: Secondary | ICD-10-CM | POA: Diagnosis not present

## 2019-04-16 DIAGNOSIS — H52211 Irregular astigmatism, right eye: Secondary | ICD-10-CM | POA: Diagnosis not present

## 2019-04-16 DIAGNOSIS — H2513 Age-related nuclear cataract, bilateral: Secondary | ICD-10-CM | POA: Diagnosis not present

## 2019-05-22 DIAGNOSIS — M546 Pain in thoracic spine: Secondary | ICD-10-CM | POA: Diagnosis not present

## 2019-05-22 DIAGNOSIS — N390 Urinary tract infection, site not specified: Secondary | ICD-10-CM | POA: Diagnosis not present

## 2019-06-03 DIAGNOSIS — E210 Primary hyperparathyroidism: Secondary | ICD-10-CM | POA: Diagnosis not present

## 2019-06-03 DIAGNOSIS — E785 Hyperlipidemia, unspecified: Secondary | ICD-10-CM | POA: Diagnosis not present

## 2019-06-03 DIAGNOSIS — I10 Essential (primary) hypertension: Secondary | ICD-10-CM | POA: Diagnosis not present

## 2019-06-03 DIAGNOSIS — E782 Mixed hyperlipidemia: Secondary | ICD-10-CM | POA: Diagnosis not present

## 2019-06-10 DIAGNOSIS — E782 Mixed hyperlipidemia: Secondary | ICD-10-CM | POA: Diagnosis not present

## 2019-06-10 DIAGNOSIS — E210 Primary hyperparathyroidism: Secondary | ICD-10-CM | POA: Diagnosis not present

## 2019-06-10 DIAGNOSIS — Z23 Encounter for immunization: Secondary | ICD-10-CM | POA: Diagnosis not present

## 2019-06-10 DIAGNOSIS — I10 Essential (primary) hypertension: Secondary | ICD-10-CM | POA: Diagnosis not present

## 2019-10-12 DIAGNOSIS — U071 COVID-19: Secondary | ICD-10-CM | POA: Diagnosis not present

## 2019-10-13 ENCOUNTER — Emergency Department (HOSPITAL_COMMUNITY): Payer: Federal, State, Local not specified - PPO

## 2019-10-13 ENCOUNTER — Encounter (HOSPITAL_COMMUNITY): Payer: Self-pay

## 2019-10-13 ENCOUNTER — Telehealth: Payer: Self-pay | Admitting: Emergency Medicine

## 2019-10-13 ENCOUNTER — Other Ambulatory Visit: Payer: Self-pay

## 2019-10-13 ENCOUNTER — Inpatient Hospital Stay (HOSPITAL_COMMUNITY)
Admission: EM | Admit: 2019-10-13 | Discharge: 2019-10-15 | DRG: 177 | Disposition: A | Discharge status: Home / Self Care | Payer: Federal, State, Local not specified - PPO | Attending: Internal Medicine | Admitting: Internal Medicine

## 2019-10-13 DIAGNOSIS — E210 Primary hyperparathyroidism: Secondary | ICD-10-CM | POA: Diagnosis not present

## 2019-10-13 DIAGNOSIS — E876 Hypokalemia: Secondary | ICD-10-CM | POA: Diagnosis present

## 2019-10-13 DIAGNOSIS — R739 Hyperglycemia, unspecified: Secondary | ICD-10-CM | POA: Diagnosis present

## 2019-10-13 DIAGNOSIS — F419 Anxiety disorder, unspecified: Secondary | ICD-10-CM | POA: Diagnosis not present

## 2019-10-13 DIAGNOSIS — Z8249 Family history of ischemic heart disease and other diseases of the circulatory system: Secondary | ICD-10-CM | POA: Diagnosis not present

## 2019-10-13 DIAGNOSIS — J9601 Acute respiratory failure with hypoxia: Secondary | ICD-10-CM | POA: Diagnosis not present

## 2019-10-13 DIAGNOSIS — Z79891 Long term (current) use of opiate analgesic: Secondary | ICD-10-CM

## 2019-10-13 DIAGNOSIS — Z79899 Other long term (current) drug therapy: Secondary | ICD-10-CM

## 2019-10-13 DIAGNOSIS — E890 Postprocedural hypothyroidism: Secondary | ICD-10-CM | POA: Diagnosis present

## 2019-10-13 DIAGNOSIS — F329 Major depressive disorder, single episode, unspecified: Secondary | ICD-10-CM | POA: Diagnosis present

## 2019-10-13 DIAGNOSIS — U071 COVID-19: Secondary | ICD-10-CM | POA: Diagnosis not present

## 2019-10-13 DIAGNOSIS — K219 Gastro-esophageal reflux disease without esophagitis: Secondary | ICD-10-CM | POA: Diagnosis not present

## 2019-10-13 DIAGNOSIS — Z7982 Long term (current) use of aspirin: Secondary | ICD-10-CM | POA: Diagnosis not present

## 2019-10-13 DIAGNOSIS — R509 Fever, unspecified: Secondary | ICD-10-CM | POA: Diagnosis not present

## 2019-10-13 DIAGNOSIS — I10 Essential (primary) hypertension: Secondary | ICD-10-CM | POA: Diagnosis not present

## 2019-10-13 DIAGNOSIS — E860 Dehydration: Secondary | ICD-10-CM | POA: Diagnosis present

## 2019-10-13 DIAGNOSIS — J1282 Pneumonia due to coronavirus disease 2019: Secondary | ICD-10-CM | POA: Diagnosis present

## 2019-10-13 DIAGNOSIS — E878 Other disorders of electrolyte and fluid balance, not elsewhere classified: Secondary | ICD-10-CM | POA: Diagnosis present

## 2019-10-13 DIAGNOSIS — R531 Weakness: Secondary | ICD-10-CM | POA: Diagnosis not present

## 2019-10-13 LAB — GLUCOSE, CAPILLARY: Glucose-Capillary: 157 mg/dL — ABNORMAL HIGH (ref 70–99)

## 2019-10-13 LAB — CBC WITH DIFFERENTIAL/PLATELET
Abs Immature Granulocytes: 0.02 10*3/uL (ref 0.00–0.07)
Basophils Absolute: 0 10*3/uL (ref 0.0–0.1)
Basophils Relative: 0 %
Eosinophils Absolute: 0 10*3/uL (ref 0.0–0.5)
Eosinophils Relative: 0 %
HCT: 44.6 % (ref 39.0–52.0)
Hemoglobin: 15.5 g/dL (ref 13.0–17.0)
Immature Granulocytes: 0 %
Lymphocytes Relative: 13 %
Lymphs Abs: 0.8 10*3/uL (ref 0.7–4.0)
MCH: 32.2 pg (ref 26.0–34.0)
MCHC: 34.8 g/dL (ref 30.0–36.0)
MCV: 92.7 fL (ref 80.0–100.0)
Monocytes Absolute: 0.2 10*3/uL (ref 0.1–1.0)
Monocytes Relative: 3 %
Neutro Abs: 5.2 10*3/uL (ref 1.7–7.7)
Neutrophils Relative %: 84 %
Platelets: 99 10*3/uL — ABNORMAL LOW (ref 150–400)
RBC: 4.81 MIL/uL (ref 4.22–5.81)
RDW: 12.4 % (ref 11.5–15.5)
WBC: 6.1 10*3/uL (ref 4.0–10.5)
nRBC: 0 % (ref 0.0–0.2)

## 2019-10-13 LAB — TROPONIN I (HIGH SENSITIVITY): Troponin I (High Sensitivity): 3 ng/L (ref <18)

## 2019-10-13 LAB — BRAIN NATRIURETIC PEPTIDE: B Natriuretic Peptide: 11 pg/mL (ref 0.0–100.0)

## 2019-10-13 LAB — C-REACTIVE PROTEIN: CRP: 18.7 mg/dL — ABNORMAL HIGH (ref <1.0)

## 2019-10-13 LAB — COMPREHENSIVE METABOLIC PANEL
ALT: 39 U/L (ref 0–44)
AST: 45 U/L — ABNORMAL HIGH (ref 15–41)
Albumin: 3.7 g/dL (ref 3.5–5.0)
Alkaline Phosphatase: 56 U/L (ref 38–126)
Anion gap: 9 (ref 5–15)
BUN: 8 mg/dL (ref 6–20)
CO2: 29 mmol/L (ref 22–32)
Calcium: 8.2 mg/dL — ABNORMAL LOW (ref 8.9–10.3)
Chloride: 97 mmol/L — ABNORMAL LOW (ref 98–111)
Creatinine, Ser: 0.93 mg/dL (ref 0.61–1.24)
GFR calc Af Amer: >60 mL/min (ref >60)
GFR calc non Af Amer: >60 mL/min (ref >60)
Glucose, Bld: 134 mg/dL — ABNORMAL HIGH (ref 70–99)
Potassium: 3.2 mmol/L — ABNORMAL LOW (ref 3.5–5.1)
Sodium: 135 mmol/L (ref 135–145)
Total Bilirubin: 0.7 mg/dL (ref 0.3–1.2)
Total Protein: 7.6 g/dL (ref 6.5–8.1)

## 2019-10-13 LAB — LACTIC ACID, PLASMA
Lactic Acid, Venous: 1.2 mmol/L (ref 0.5–1.9)
Lactic Acid, Venous: 1.4 mmol/L (ref 0.5–1.9)

## 2019-10-13 LAB — HIV ANTIBODY (ROUTINE TESTING W REFLEX): HIV Screen 4th Generation wRfx: NONREACTIVE

## 2019-10-13 LAB — FIBRINOGEN: Fibrinogen: 636 mg/dL — ABNORMAL HIGH (ref 210–475)

## 2019-10-13 LAB — PROCALCITONIN: Procalcitonin: 0.24 ng/mL

## 2019-10-13 LAB — RESPIRATORY PANEL BY RT PCR (FLU A&B, COVID)
Influenza A by PCR: NEGATIVE
Influenza B by PCR: NEGATIVE
SARS Coronavirus 2 by RT PCR: POSITIVE — AB

## 2019-10-13 LAB — ABO/RH: ABO/RH(D): O NEG

## 2019-10-13 LAB — FERRITIN: Ferritin: 839 ng/mL — ABNORMAL HIGH (ref 24–336)

## 2019-10-13 LAB — D-DIMER, QUANTITATIVE: D-Dimer, Quant: 0.6 ug/mL-FEU — ABNORMAL HIGH (ref 0.00–0.50)

## 2019-10-13 LAB — CBG MONITORING, ED: Glucose-Capillary: 104 mg/dL — ABNORMAL HIGH (ref 70–99)

## 2019-10-13 LAB — TRIGLYCERIDES: Triglycerides: 120 mg/dL (ref <150)

## 2019-10-13 MED ORDER — ENOXAPARIN SODIUM 40 MG/0.4ML ~~LOC~~ SOLN
40.0000 mg | SUBCUTANEOUS | Status: DC
  Administered 2019-10-13 – 2019-10-14 (×2): 40 mg via SUBCUTANEOUS
  Filled 2019-10-13 (×2): qty 0.4

## 2019-10-13 MED ORDER — ACETAMINOPHEN 325 MG PO TABS: 650.0000 mg | ORAL_TABLET | Freq: Four times a day (QID) | ORAL | Status: DC | PRN

## 2019-10-13 MED ORDER — LISINOPRIL 10 MG PO TABS
10.0000 mg | ORAL_TABLET | Freq: Every day | ORAL | Status: DC
  Filled 2019-10-13 (×2): qty 1

## 2019-10-13 MED ORDER — INSULIN ASPART 100 UNIT/ML ~~LOC~~ SOLN
0.0000 [IU] | SUBCUTANEOUS | Status: DC
  Administered 2019-10-14: 12:00:00 1 [IU] via SUBCUTANEOUS
  Administered 2019-10-14: 21:00:00 2 [IU] via SUBCUTANEOUS
  Administered 2019-10-15 (×3): 1 [IU] via SUBCUTANEOUS

## 2019-10-13 MED ORDER — ONDANSETRON HCL 4 MG/2ML IJ SOLN: 4.0000 mg | Freq: Four times a day (QID) | INTRAMUSCULAR | Status: DC | PRN

## 2019-10-13 MED ORDER — ALBUTEROL SULFATE HFA 108 (90 BASE) MCG/ACT IN AERS: 2.0000 | INHALATION_SPRAY | Freq: Four times a day (QID) | RESPIRATORY_TRACT | Status: DC | PRN

## 2019-10-13 MED ORDER — PANTOPRAZOLE SODIUM 40 MG PO TBEC
40.0000 mg | DELAYED_RELEASE_TABLET | Freq: Every day | ORAL | Status: DC
  Administered 2019-10-13 – 2019-10-15 (×3): 40 mg via ORAL
  Filled 2019-10-13 (×3): qty 1

## 2019-10-13 MED ORDER — POTASSIUM CHLORIDE IN NACL 20-0.9 MEQ/L-% IV SOLN
INTRAVENOUS | Status: DC
  Filled 2019-10-13: qty 1000

## 2019-10-13 MED ORDER — SODIUM CHLORIDE 0.9 % IV SOLN
100.0000 mg | Freq: Every day | INTRAVENOUS | Status: DC
  Administered 2019-10-14 – 2019-10-15 (×2): 100 mg via INTRAVENOUS
  Filled 2019-10-13 (×3): qty 20

## 2019-10-13 MED ORDER — GUAIFENESIN-DM 100-10 MG/5ML PO SYRP
10.0000 mL | ORAL_SOLUTION | ORAL | Status: DC | PRN
  Administered 2019-10-14: 10 mL via ORAL
  Filled 2019-10-13: qty 10

## 2019-10-13 MED ORDER — POTASSIUM CHLORIDE 10 MEQ/100ML IV SOLN
10.0000 meq | Freq: Once | INTRAVENOUS | Status: AC
  Administered 2019-10-13: 14:00:00 10 meq via INTRAVENOUS
  Filled 2019-10-13: qty 100

## 2019-10-13 MED ORDER — ONDANSETRON HCL 4 MG PO TABS: 4.0000 mg | ORAL_TABLET | Freq: Four times a day (QID) | ORAL | Status: DC | PRN

## 2019-10-13 MED ORDER — SODIUM CHLORIDE 0.9 % IV SOLN
200.0000 mg | Freq: Once | INTRAVENOUS | Status: AC
  Administered 2019-10-13: 200 mg via INTRAVENOUS
  Filled 2019-10-13: qty 40

## 2019-10-13 MED ORDER — ALBUTEROL SULFATE HFA 108 (90 BASE) MCG/ACT IN AERS
2.0000 | INHALATION_SPRAY | Freq: Four times a day (QID) | RESPIRATORY_TRACT | Status: DC
  Administered 2019-10-13 (×2): 2 via RESPIRATORY_TRACT
  Filled 2019-10-13 (×2): qty 6.7

## 2019-10-13 MED ORDER — METHYLPREDNISOLONE SODIUM SUCC 125 MG IJ SOLR
0.5000 mg/kg | Freq: Two times a day (BID) | INTRAMUSCULAR | Status: DC
  Administered 2019-10-13 – 2019-10-15 (×4): 41.875 mg via INTRAVENOUS
  Filled 2019-10-13 (×4): qty 2

## 2019-10-13 MED ORDER — ZINC SULFATE 220 (50 ZN) MG PO CAPS
220.0000 mg | ORAL_CAPSULE | Freq: Every day | ORAL | Status: DC
  Administered 2019-10-13 – 2019-10-15 (×3): 220 mg via ORAL
  Filled 2019-10-13 (×3): qty 1

## 2019-10-13 MED ORDER — ASCORBIC ACID 500 MG PO TABS
500.0000 mg | ORAL_TABLET | Freq: Every day | ORAL | Status: DC
  Administered 2019-10-13 – 2019-10-15 (×3): 500 mg via ORAL
  Filled 2019-10-13 (×3): qty 1

## 2019-10-13 MED ORDER — BUPROPION HCL ER (XL) 150 MG PO TB24
150.0000 mg | ORAL_TABLET | Freq: Every day | ORAL | Status: DC
  Administered 2019-10-14 – 2019-10-15 (×2): 150 mg via ORAL
  Filled 2019-10-13 (×3): qty 1

## 2019-10-13 MED ORDER — IPRATROPIUM-ALBUTEROL 20-100 MCG/ACT IN AERS: 1.0000 | INHALATION_SPRAY | Freq: Four times a day (QID) | RESPIRATORY_TRACT | Status: DC

## 2019-10-13 NOTE — Plan of Care (Signed)

## 2019-10-13 NOTE — ED Provider Notes (Signed)
[facility] EMERGENCY DEPARTMENT Provider Note   CSN: [id number] Arrival date & time: 10/13/19  [id number]     History Chief Complaint  Patient presents with  . COVID +    Jordan Reid is a 54 y.o. male.  HPI   This patient is a 54 year old male with a history of hypertension hyperparathyroidism and anxiety.  He was recently diagnosed with coronavirus 2019 after having 1 week of symptoms.  He was diagnosed 5 days ago but has had persistent fever, generalized weakness, also having occasional cough and shortness of breath.  He reports that his wife was measuring his oxygen and it was around 84% at home, he comes into the hospital with abnormal vital signs as well.  Symptoms are persistent, gradually worsening, fever does not seem to go down very much with the medications and has been as high as 102.  He denies history of diabetes heart disease or stroke.  He does not use any oxygen at home  Past Medical History:  Diagnosis Date  . Anxiety   . Hyperparathyroidism ([place])   . Hypertension     Patient Active Problem List   Diagnosis Date Noted  . Parathyroid abnormality ([place]) 07/23/2016  . Hyperparathyroidism, primary ([place]) 07/22/2016  . Abdominal pain, left upper quadrant 07/31/2012  . External hemorrhoids 07/31/2012    Past Surgical History:  Procedure Laterality Date  . COLONOSCOPY N/A 01/26/2016   Procedure: COLONOSCOPY;  Surgeon: Rogene Houston, MD;  Location: [facility] ENDO SUITE;  Service: Endoscopy;  Laterality: N/A;  830  . EYE SURGERY     lasik bilateral  . PARATHYROIDECTOMY Left 07/23/2016   Procedure: LEFT PARATHYROIDECTOMY;  Surgeon: Armandina Gemma, MD;  Location: [facility] ORS;  Service: General;  Laterality: Left;  . temporal lesion removed     left       Family History  Problem Relation Age of Onset  . Heart attack Father   . Lung cancer Maternal Grandmother     Social History   Tobacco Use  . Smoking status: Never Smoker  . Smokeless tobacco: Never Used  Substance Use  Topics  . Alcohol use: Yes    Comment: occas   . Drug use: No    Home Medications Prior to Admission medications   Medication Sig Start Date End Date Taking? Authorizing Provider  buPROPion (WELLBUTRIN XL) 150 MG 24 hr tablet Take 150 mg by mouth daily.    [provider]  HYDROcodone-acetaminophen (NORCO/VICODIN) 5-325 MG tablet Take 1-2 tablets by mouth every 4 (four) hours as needed for moderate pain. 07/24/16   Armandina Gemma, MD  lisinopril (PRINIVIL,ZESTRIL) 10 MG tablet Take 10 mg by mouth daily.    [provider]    Allergies    Patient has no known allergies.  Review of Systems   Review of Systems  All other systems reviewed and are negative.   Physical Exam Updated Vital Signs BP 136/89   Pulse (!) 111   Temp 99.2 F (37.3 C) (Oral)   Resp (!) 21   Ht 1.702 m (5\' 7" )   Wt 83.9 kg   SpO2 93%   BMI 28.98 kg/m   Physical Exam Vitals and nursing note reviewed.  Constitutional:      General: He is not in acute distress.    Appearance: He is well-developed.  HENT:     Head: Normocephalic and atraumatic.     Mouth/Throat:     Pharynx: No oropharyngeal exudate.  Eyes:     General:  No scleral icterus.       Right eye: No discharge.        Left eye: No discharge.     Conjunctiva/sclera: Conjunctivae normal.     Pupils: Pupils are equal, round, and reactive to light.  Neck:     Thyroid: No thyromegaly.     Vascular: No JVD.  Cardiovascular:     Rate and Rhythm: Regular rhythm. Tachycardia present.     Heart sounds: Normal heart sounds. No murmur. No friction rub. No gallop.      Comments: Normal pulses at the radial arteries, no JVD, no peripheral edema.  Heart rate is approximately 120 in a sinus tachycardic rhythm Pulmonary:     Effort: Pulmonary effort is normal. No respiratory distress.     Breath sounds: Rales present. No wheezing.     Comments: The patient has mild tachypnea and rales at the bases bilaterally, oxygen between 89 and  92% Abdominal:     General: Bowel sounds are normal. There is no distension.     Palpations: Abdomen is soft. There is no mass.     Tenderness: There is no abdominal tenderness.  Musculoskeletal:        General: No tenderness. Normal range of motion.     Cervical back: Normal range of motion and neck supple.  Lymphadenopathy:     Cervical: No cervical adenopathy.  Skin:    General: Skin is warm and dry.     Findings: No erythema or rash.  Neurological:     Mental Status: He is alert.     Coordination: Coordination normal.  Psychiatric:        Behavior: Behavior normal.     ED Results / Procedures / Treatments   Labs (all labs ordered are listed, but only abnormal results are displayed) Labs Reviewed  CBC WITH DIFFERENTIAL/PLATELET - Abnormal; Notable for the following components:      Result Value   Platelets 99 (*)    All other components within normal limits  COMPREHENSIVE METABOLIC PANEL - Abnormal; Notable for the following components:   Potassium 3.2 (*)    Chloride 97 (*)    Glucose, Bld 134 (*)    Calcium 8.2 (*)    AST 45 (*)    All other components within normal limits  D-DIMER, QUANTITATIVE (NOT AT [facility]) - Abnormal; Notable for the following components:   D-Dimer, Quant 0.60 (*)    All other components within normal limits  FERRITIN - Abnormal; Notable for the following components:   Ferritin 839 (*)    All other components within normal limits  FIBRINOGEN - Abnormal; Notable for the following components:   Fibrinogen 636 (*)    All other components within normal limits  C-REACTIVE PROTEIN - Abnormal; Notable for the following components:   CRP 18.7 (*)    All other components within normal limits  CULTURE, BLOOD (ROUTINE X 2)  CULTURE, BLOOD (ROUTINE X 2)  RESPIRATORY PANEL BY RT PCR (FLU A&B, COVID)  PROCALCITONIN  TRIGLYCERIDES  BRAIN NATRIURETIC PEPTIDE  LACTIC ACID, PLASMA  LACTIC ACID, PLASMA  TROPONIN I (HIGH SENSITIVITY)    EKG EKG  Interpretation  Date/Time:  Tuesday October 13 2019 10:13:09 EST Ventricular Rate:  123 PR Interval:    QRS Duration: 86 QT Interval:  424 QTC Calculation: 607 R Axis:   66 Text Interpretation:  Critical Test Result: Long QTc Sinus tachycardia Nonspecific ST and T wave abnormality Abnormal ECG Since last tracing rate faster Confirmed by  Noemi Chapel [id number]) on 10/13/2019 10:28:27 AM   Radiology DG Chest Port 1 View  Result Date: 10/13/2019 CLINICAL DATA:  Fever this morning. The patient tested positive for COVID-19 10/09/2019. EXAM: PORTABLE CHEST 1 VIEW COMPARISON:  PA and lateral chest 10/11/2015. FINDINGS: Hazy airspace disease is seen in the periphery of the left mid lung. The lungs are otherwise clear. Heart size is normal. No pneumothorax or pleural effusion. IMPRESSION: Hazy airspace disease in the left mid lung worrisome for pneumonia. Electronically Signed   By: Inge Rise M.D.   On: 10/13/2019 11:23    Procedures .Critical Care Performed by: Noemi Chapel, MD Authorized by: Noemi Chapel, MD   Critical care provider statement:    Critical care time (minutes):  35   Critical care time was exclusive of:  Separately billable procedures and treating other patients and teaching time   Critical care was necessary to treat or prevent imminent or life-threatening deterioration of the following conditions:  Respiratory failure   Critical care was time spent personally by me on the following activities:  Blood draw for specimens, development of treatment plan with patient or surrogate, discussions with consultants, evaluation of patient's response to treatment, examination of patient, obtaining history from patient or surrogate, ordering and performing treatments and interventions, ordering and review of laboratory studies, ordering and review of radiographic studies, pulse oximetry, re-evaluation of patient's condition and review of old charts Comments:         (including  critical care time)  Medications Ordered in ED Medications  potassium chloride 10 mEq in 100 mL IVPB (has no administration in time range)    ED Course  I have reviewed the triage vital signs and the nursing notes.  Pertinent labs & imaging results that were available during my care of the patient were reviewed by me and considered in my medical decision making (see chart for details).    MDM Rules/Calculators/A&P                      The patient has lost sense of taste and smell, he has mild cough but has rales on exam with some hypoxia.  His heart rate is 120 with an oxygen of 89 to 92%.  He will be placed on 2 L by nasal cannula, he does appear to have acute respiratory failure secondary to hypoxia and coronavirus 2019 of pneumonia.  X-ray, oxygen, admit to the hospital for remdesivir.  11:04 AM Cardiac monitoring reveals sinus tachycardia (Rate & rhythm), as reviewed and interpreted by me. Cardiac monitoring was ordered due to shortness of breath hypoxia and tachycardia and to monitor patient for dysrhythmia.  I discussed the care with the hospitalist who will admit the patient to the hospital.  The patient has Covid pneumonia  Jordan Reid was evaluated in Emergency Department on 10/13/2019 for the symptoms described in the history of present illness. He was evaluated in the context of the global COVID-19 pandemic, which necessitated consideration that the patient might be at risk for infection with the SARS-CoV-2 virus that causes COVID-19. Institutional protocols and algorithms that pertain to the evaluation of patients at risk for COVID-19 are in a state of rapid change based on information released by regulatory bodies including the CDC and federal and state organizations. These policies and algorithms were followed during the patient's care in the ED.   Final Clinical Impression(s) / ED Diagnoses Final diagnoses:  Pneumonia due to COVID-19 virus  Acute respiratory failure with  hypoxia ([place])  Hypokalemia    Rx / DC Orders ED Discharge Orders    None       Noemi Chapel, MD 10/13/19 1244

## 2019-10-13 NOTE — H&P (Signed)
History and Physical    Jordan Reid [id number] DOB: 1965/11/28 DOA: 10/13/2019  Referring MD/NP/PA: Dr. Sabra Heck. PCP: Celene Squibb, MD  Patient coming from: Home  Chief Complaint: Worsening shortness of breath, general malaise and fever  HPI: Jordan Reid is a 54 y.o. male with a past medical history significant for depression/anxiety, hypertension and primary hyperparathyroidism (status post parathyroidectomy in 2017); who presented to the emergency department secondary to worsening shortness of breath, general malaise, fever and hypoxia.  Patient with recent positive diagnosis of Covid was monitoring himself and using supportive care at home.  Despite the use of antipyretics (Tylenol) he continues spiking high-grade fever and his breathing decompensates.  Patient reported that his wife check oxygen saturation and was as low at 84% at home.  Patient expressed also some decrease intake and intermittent nausea. No vomiting, no chest pain, no blurred vision, headaches, hematuria, dysuria, melena, hematochezia, focal weakness, hemoptysis or any other complaints. Patient is no previously on oxygen supplementation at home.  In the ED chest x-ray demonstrated bilateral interstitial airspace disease (left more than right), patient was found to be hypoxic 87-88 at rest with improvement to 94-96 percent on 2 L nasal cannula supplementation; positive SARS Covid confirmatory test and elevated inflammatory markers.  Patient initiated on remdesivir, steroids and oxygen supplementation.  [facility] has been consulted patient for further evaluation and management.  Mild hypokalemia and hypochloremia also appreciated.  Past Medical/Surgical History: Past Medical History:  Diagnosis Date  . Anxiety   . Hyperparathyroidism ([place])   . Hypertension     Past Surgical History:  Procedure Laterality Date  . COLONOSCOPY N/A 01/26/2016   Procedure: COLONOSCOPY;  Surgeon: Rogene Houston, MD;  Location: [facility];  Service: Endoscopy;  Laterality: N/A;  830  . EYE SURGERY     lasik bilateral  . PARATHYROIDECTOMY Left 07/23/2016   Procedure: LEFT PARATHYROIDECTOMY;  Surgeon: Armandina Gemma, MD;  Location: [facility];  Service: General;  Laterality: Left;  . temporal lesion removed     left    Social History:  reports that he has never smoked. He has never used smokeless tobacco. He reports current alcohol use. He reports that he does not use drugs.  Allergies: No Known Allergies  Family History:  Family History  Problem Relation Age of Onset  . Heart attack Father   . Lung cancer Maternal Grandmother     Prior to Admission medications   Medication Sig Start Date End Date Taking? Authorizing Provider  aspirin EC 81 MG tablet Take 162 mg by mouth daily.   Yes [provider]  azithromycin (ZITHROMAX) 250 MG tablet Take 250-500 mg by mouth as directed. Take 500mg  on day 1, then take 250mg  on days 2 through 5 10/12/19  Yes [provider]  buPROPion (WELLBUTRIN XL) 150 MG 24 hr tablet Take 150 mg by mouth every morning.    Yes [provider]  cetirizine (ZYRTEC) 10 MG tablet Take 10 mg by mouth every evening.   Yes [provider]  cholecalciferol (VITAMIN D3) 25 MCG (1000 UNIT) tablet Take 1,000 Units by mouth daily.   Yes [provider]  ibuprofen (ADVIL) 200 MG tablet Take 200 mg by mouth every 6 (six) hours as needed for mild pain or moderate pain.   Yes [provider]  Javier Docker Oil 500 MG CAPS Take 1 capsule by mouth daily.   Yes [provider]  losartan (COZAAR) 25 MG tablet Take 25 mg by  mouth every morning.  08/24/19  Yes [provider]  Red Yeast Rice Extract (RED YEAST RICE PO) Take 1 tablet by mouth daily.   Yes [provider]    Review of Systems:  Negative except as otherwise mentioned in HPI.   Physical Exam: Vitals:   10/13/19 1530 10/13/19 1545 10/13/19 1600 10/13/19 1615  BP:      Pulse: (!)  119 (!) 118 (!) 118 (!) 116  Resp: 19 20 (!) 28 (!) 24  Temp:      TempSrc:      SpO2: 92% 93% 93% 94%  Weight:      Height:        Constitutional: Denies chest pain; mild distress secondary to shortness of breath and general malaise.  Reported intermittent nausea but no active vomiting.  Patient report being febrile at home and took Tylenol prior to presented to the ED.   Eyes: PERRL, lids and conjunctivae normal,-no icterus, no nystagmus. ENMT: Mucous membranes are dry on exam. Posterior pharynx clear of any exudate or lesions. Neck: normal, supple, no masses, no thyromegaly, no JVD Respiratory: Bilateral rhonchi; no wheezing, positive tachypnea; no using accessory muscles.  Requiring 2 L nasal cannula supplementation to maintain O2 sat above 92%. Cardiovascular: Sinus tachycardia, no rubs, no gallops, no murmurs, no JVD on exam. No extremity edema. 2+ pedal pulses. No carotid bruits.  Abdomen: no tenderness, no masses palpated. No hepatosplenomegaly. Bowel sounds positive.  Musculoskeletal: no clubbing / cyanosis. No joint deformity upper and lower extremities. Good ROM, no contractures. Normal muscle tone.  Skin: no rashes, lesions, ulcers. No induration Neurologic: CN 2-12 grossly intact. Sensation intact, DTR normal. Strength 5/5 in all 4.  Psychiatric: Normal judgment and insight. Alert and oriented x 3. Normal mood.    Labs on Admission: I have personally reviewed the following labs and imaging studies  CBC: Recent Labs  Lab 10/13/19 1130  WBC 6.1  NEUTROABS 5.2  HGB 15.5  HCT 44.6  MCV 92.7  PLT 99*   Basic Metabolic Panel: Recent Labs  Lab 10/13/19 1130  NA 135  K 3.2*  CL 97*  CO2 29  GLUCOSE 134*  BUN 8  CREATININE 0.93  CALCIUM 8.2*   GFR: Estimated Creatinine Clearance: 94 mL/min (by C-G formula based on SCr of 0.93 mg/dL).   Liver Function Tests: Recent Labs  Lab 10/13/19 1130  AST 45*  ALT 39  ALKPHOS 56  BILITOT 0.7  PROT 7.6  ALBUMIN 3.7     CBG: Recent Labs  Lab 10/13/19 1510  GLUCAP 104*   Lipid Profile: Recent Labs    10/13/19 1131  TRIG 120   Anemia Panel: Recent Labs    10/13/19 1130  FERRITIN 839*   Urine analysis:    Component Value Date/Time   COLORURINE YELLOW 10/11/2015 2011   APPEARANCEUR CLEAR 10/11/2015 2011   [place] 1.017 10/11/2015 2011   PHURINE 8.0 10/11/2015 2011   GLUCOSEU NEGATIVE 10/11/2015 2011   HGBUR NEGATIVE 10/11/2015 2011   [place] NEGATIVE 10/11/2015 2011   [place] NEGATIVE 10/11/2015 2011   PROTEINUR NEGATIVE 10/11/2015 2011   NITRITE NEGATIVE 10/11/2015 2011   LEUKOCYTESUR NEGATIVE 10/11/2015 2011    Recent Results (from the past 240 hour(s))  Respiratory Panel by RT PCR (Flu A&B, Covid) - Nasopharyngeal Swab     Status: Abnormal   Collection Time: 10/13/19 11:29 AM   Specimen: Nasopharyngeal Swab  Result Value Ref Range Status   SARS Coronavirus 2 by RT PCR POSITIVE (  A) NEGATIVE Final    Comment: RESULT CALLED TO, READ BACK BY AND VERIFIED WITH: CARDWELL,L@1245  BY MATTHEWS, B 1.26.21 (NOTE) SARS-CoV-2 target nucleic acids are DETECTED. SARS-CoV-2 RNA is generally detectable in upper respiratory specimens  during the acute phase of infection. Positive results are indicative of the presence of the identified virus, but do not rule out bacterial infection or co-infection with other pathogens not detected by the test. Clinical correlation with patient history and other diagnostic information is necessary to determine patient infection status. The expected result is Negative. Fact Sheet for Patients:  PinkCheek.be Fact Sheet for Healthcare Providers: GravelBags.it This test is not yet approved or cleared by the [place] FDA and  has been authorized for detection and/or diagnosis of SARS-CoV-2 by FDA under an Emergency Use Authorization (EUA).  This EUA will remain in effect (meaning this test can be  used ) for the duration of  the COVID-19 declaration under Section 564(b)(1) of the Act, 21 U.S.C. section 360bbb-3(b)(1), unless the authorization is terminated or revoked sooner.    Influenza A by PCR NEGATIVE NEGATIVE Final   Influenza B by PCR NEGATIVE NEGATIVE Final    Comment: (NOTE) The Xpert Xpress SARS-CoV-2/FLU/RSV assay is intended as an aid in  the diagnosis of influenza from Nasopharyngeal swab specimens and  should not be used as a sole basis for treatment. Nasal washings and  aspirates are unacceptable for Xpert Xpress SARS-CoV-2/FLU/RSV  testing. Fact Sheet for Patients: PinkCheek.be Fact Sheet for Healthcare Providers: GravelBags.it This test is not yet approved or cleared by the [place] FDA and  has been authorized for detection and/or diagnosis of SARS-CoV-2 by  FDA under an Emergency Use Authorization (EUA). This EUA will remain  in effect (meaning this test can be used) for the duration of the  Covid-19 declaration under Section 564(b)(1) of the Act, 21  U.S.C. section 360bbb-3(b)(1), unless the authorization is  terminated or revoked. Performed at [facility], [address] [place], [place] [address]   Blood Culture (routine x 2)     Status: None (Preliminary result)   Collection Time: 10/13/19 11:31 AM   Specimen: Left Antecubital; Blood  Result Value Ref Range Status   Specimen Description LEFT ANTECUBITAL  Final   Special Requests   Final    BOTTLES DRAWN AEROBIC AND ANAEROBIC Blood Culture adequate volume Performed at [facility], [address], [place], [place] [address]    Culture PENDING  Incomplete   Report Status PENDING  Incomplete     Radiological Exams on Admission: DG Chest Port 1 View  Result Date: 10/13/2019 CLINICAL DATA:  Fever this morning. The patient tested positive for COVID-19 10/09/2019. EXAM: PORTABLE CHEST 1 VIEW COMPARISON:  PA and lateral chest 10/11/2015.  FINDINGS: Hazy airspace disease is seen in the periphery of the left mid lung. The lungs are otherwise clear. Heart size is normal. No pneumothorax or pleural effusion. IMPRESSION: Hazy airspace disease in the left mid lung worrisome for pneumonia. Electronically Signed   By: Inge Rise M.D.   On: 10/13/2019 11:23    EKG: Independently reviewed.  No acute ischemic changes.  Assessment/Plan 1-Acute hypoxemic respiratory failure ([place]): In the setting of COVID-19 pneumonia -Patient with positive SARS Covid test confirming outpatient positive tests reported. -CRP 18.7, Procalcitonin 0.24, ferritin 839, fibrinogen 636, negative BNP and normal triglycerides, D-dimer 0.6; lactic acid 1.2 -Patient with bilateral interstitial changes on his chest x-ray (more left than right) and hypoxia with O2 sats 88-89% -Improvement on his  oxygen saturation after 2 L nasal cannula supplementation. -Started on remdesivir and IV steroids -Vitamin C, zinc, flutter valve and as needed bronchodilators and antitussives. -Will use as needed antipyretics -Provide fluid resuscitation and follow clinical response. -If respiratory status continued to decompensate will initiate treatment with Actemra   2-Hyperparathyroidism, primary ([place]) -Status post surgical resection -Will check phosphorus, calcium and vitamin D.  3-HTN (hypertension), benign -Continue lisinopril  4-Hypokalemia -In the setting of GI losses and decreased oral intake -Will replete and follow electrolytes trend  5-GERD (gastroesophageal reflux disease)/GI prophylactics -Continue PPI.  6-depression/anxiety -Continue bupropion  7-dehydration -will provide IVF's  8-hyperglycemia -No prior history of diabetes -Will check A1c -Given treatment with the steroids will use SSI as per protocol  DVT prophylaxis: Lovenox Code Status: Full code Family Communication: No family at bedside. Disposition Plan: Anticipate discharge back home once  respiratory status stabilized. Consults called: None Admission status: Inpatient, length of stay more than 2 midnights; MedSurg bed.   Time Spent: 70 minutes.  Barton Dubois MD [facility] Pager [phone]   10/13/2019, 6:28 PM

## 2019-10-13 NOTE — ED Triage Notes (Signed)
Pt diagnosed with COVID with COVID on Friday, the 22nd. Pt took Tylenol 500 mg at 0700. Temp was 101 this morning, but here in ED it is 99.2. NAD. O2 level 93% on RA

## 2019-10-14 DIAGNOSIS — J9601 Acute respiratory failure with hypoxia: Secondary | ICD-10-CM

## 2019-10-14 LAB — VITAMIN D 25 HYDROXY (VIT D DEFICIENCY, FRACTURES): Vit D, 25-Hydroxy: 42.5 ng/mL (ref 30–100)

## 2019-10-14 LAB — CBC WITH DIFFERENTIAL/PLATELET
Abs Immature Granulocytes: 0.04 10*3/uL (ref 0.00–0.07)
Basophils Absolute: 0 10*3/uL (ref 0.0–0.1)
Basophils Relative: 0 %
Eosinophils Absolute: 0 10*3/uL (ref 0.0–0.5)
Eosinophils Relative: 0 %
HCT: 42.7 % (ref 39.0–52.0)
Hemoglobin: 14.1 g/dL (ref 13.0–17.0)
Immature Granulocytes: 1 %
Lymphocytes Relative: 9 %
Lymphs Abs: 0.5 10*3/uL — ABNORMAL LOW (ref 0.7–4.0)
MCH: 31.6 pg (ref 26.0–34.0)
MCHC: 33 g/dL (ref 30.0–36.0)
MCV: 95.7 fL (ref 80.0–100.0)
Monocytes Absolute: 0.1 10*3/uL (ref 0.1–1.0)
Monocytes Relative: 2 %
Neutro Abs: 5.4 10*3/uL (ref 1.7–7.7)
Neutrophils Relative %: 88 %
Platelets: 118 10*3/uL — ABNORMAL LOW (ref 150–400)
RBC: 4.46 MIL/uL (ref 4.22–5.81)
RDW: 12.7 % (ref 11.5–15.5)
WBC: 6.1 10*3/uL (ref 4.0–10.5)
nRBC: 0 % (ref 0.0–0.2)

## 2019-10-14 LAB — D-DIMER, QUANTITATIVE: D-Dimer, Quant: 0.52 ug/mL-FEU — ABNORMAL HIGH (ref 0.00–0.50)

## 2019-10-14 LAB — COMPREHENSIVE METABOLIC PANEL
ALT: 47 U/L — ABNORMAL HIGH (ref 0–44)
AST: 52 U/L — ABNORMAL HIGH (ref 15–41)
Albumin: 3.2 g/dL — ABNORMAL LOW (ref 3.5–5.0)
Alkaline Phosphatase: 52 U/L (ref 38–126)
Anion gap: 10 (ref 5–15)
BUN: 15 mg/dL (ref 6–20)
CO2: 28 mmol/L (ref 22–32)
Calcium: 8.1 mg/dL — ABNORMAL LOW (ref 8.9–10.3)
Chloride: 101 mmol/L (ref 98–111)
Creatinine, Ser: 0.84 mg/dL (ref 0.61–1.24)
GFR calc Af Amer: >60 mL/min (ref >60)
GFR calc non Af Amer: >60 mL/min (ref >60)
Glucose, Bld: 163 mg/dL — ABNORMAL HIGH (ref 70–99)
Potassium: 3.9 mmol/L (ref 3.5–5.1)
Sodium: 139 mmol/L (ref 135–145)
Total Bilirubin: 0.7 mg/dL (ref 0.3–1.2)
Total Protein: 6.9 g/dL (ref 6.5–8.1)

## 2019-10-14 LAB — GLUCOSE, CAPILLARY
Glucose-Capillary: 130 mg/dL — ABNORMAL HIGH (ref 70–99)
Glucose-Capillary: 142 mg/dL — ABNORMAL HIGH (ref 70–99)
Glucose-Capillary: 143 mg/dL — ABNORMAL HIGH (ref 70–99)
Glucose-Capillary: 148 mg/dL — ABNORMAL HIGH (ref 70–99)
Glucose-Capillary: 161 mg/dL — ABNORMAL HIGH (ref 70–99)
Glucose-Capillary: 184 mg/dL — ABNORMAL HIGH (ref 70–99)

## 2019-10-14 LAB — C-REACTIVE PROTEIN: CRP: 19.4 mg/dL — ABNORMAL HIGH (ref <1.0)

## 2019-10-14 LAB — MAGNESIUM: Magnesium: 2.5 mg/dL — ABNORMAL HIGH (ref 1.7–2.4)

## 2019-10-14 LAB — PHOSPHORUS: Phosphorus: 2.9 mg/dL (ref 2.5–4.6)

## 2019-10-14 NOTE — Plan of Care (Signed)
  Problem: Clinical Measurements: Goal: Respiratory complications will improve Outcome: Progressing  Pt is tolerating room air well and maintaining an O2 sat of 93% with no signs of shortness of breath. Problem: Clinical Measurements: Goal: Cardiovascular complication will be avoided Outcome: Not Progressing  Pt has become tachycardic this afternoon up to 130. Pt denies any chest pain or shortness of breath. Pt reports no palpitations. Dr notified.

## 2019-10-14 NOTE — Progress Notes (Signed)
Pt became tachycardic with a heart rate up to 130s. Pt denies chest pain or shortness of breath. Pt also denies palpitations. All other vitals within normal limits except slightly hypertensive. Pt is asymptomatic with no complaints at this time. Dr. Manuella Ghazi notified.

## 2019-10-14 NOTE — Progress Notes (Signed)
PROGRESS NOTE    Jordan Reid  [id number] DOB: 1966/03/07 DOA: 10/13/2019 PCP: Celene Squibb, MD   Brief Narrative:  Per HPI: Jordan Reid is a 54 y.o. male with a past medical history significant for depression/anxiety, hypertension and primary hyperparathyroidism (status post parathyroidectomy in 2017); who presented to the emergency department secondary to worsening shortness of breath, general malaise, fever and hypoxia.  Patient with recent positive diagnosis of Covid was monitoring himself and using supportive care at home.  Despite the use of antipyretics (Tylenol) he continues spiking high-grade fever and his breathing decompensates.  Patient reported that his wife check oxygen saturation and was as low at 84% at home.  Patient expressed also some decrease intake and intermittent nausea. No vomiting, no chest pain, no blurred vision, headaches, hematuria, dysuria, melena, hematochezia, focal weakness, hemoptysis or any other complaints. Patient is no previously on oxygen supplementation at home.  In the ED chest x-ray demonstrated bilateral interstitial airspace disease (left more than right), patient was found to be hypoxic 87-88 at rest with improvement to 94-96 percent on 2 L nasal cannula supplementation; positive SARS Covid confirmatory test and elevated inflammatory markers.  Patient initiated on remdesivir, steroids and oxygen supplementation.  TR[facility] has been consulted patient for further evaluation and management.  Mild hypokalemia and hypochloremia also appreciated.  1/27: Patient was admitted with acute hypoxemic respiratory failure in the setting of COVID-19 pneumonia.  He has been weaned off oxygen, but continues to remain symptomatic.  His inflammatory markers are still quite elevated and upward trending today.  We will plan to continue current IV medications in the inpatient setting and anticipate discharge in a.m. if stabilized and improved.  Assessment & Plan:    Principal Problem:   Acute hypoxemic respiratory failure (HCC) Active Problems:   Hyperparathyroidism, primary (HCC)   HTN (hypertension), benign   Hypokalemia   GERD (gastroesophageal reflux disease)   1-Acute hypoxemic respiratory failure ([place]): In the setting of COVID-19 pneumonia -Patient with positive SARS Covid test confirming outpatient positive tests reported. -CRP upward trending to 19.4 this morning.  Continue to trend and monitor symptoms.  Anticipate discharge in a.m. if stable and improved. -Patient with bilateral interstitial changes on his chest x-ray (more left than right) and hypoxia with O2 sats 88-89% -Improvement on his oxygen saturation after 2 L nasal cannula supplementation.  Plan to wean today. -Started on remdesivir and IV steroids -Vitamin C, zinc, flutter valve and as needed bronchodilators and antitussives. -Will use as needed antipyretics -Provide fluid resuscitation and follow clinical response. -If respiratory status continued to decompensate will initiate treatment with Actemra   2-Hyperparathyroidism, primary ([place]) -Status post surgical resection -Calcium 8.1 and phosphorus 2.9  3-HTN (hypertension), benign-controlled -Continue lisinopril  4-Hypokalemia-resolved -In the setting of GI losses and decreased oral intake -Will replete and follow electrolytes trend  5-GERD (gastroesophageal reflux disease)/GI prophylactics -Continue PPI.  6-depression/anxiety -Continue bupropion  7-dehydration-improved -DC further IV fluid  8-hyperglycemia-stable -No prior history of diabetes -Will check A1c -Given treatment with the steroids will use SSI as per protocol   DVT prophylaxis: Lovenox Code Status: Full Family Communication: None at bedside Disposition Plan: Anticipate discharge in a.m. if inflammatory markers are downtrending and patient is further improved.  We will set up outpatient infusion.   Consultants:   None  Procedures:    None  Antimicrobials:  Anti-infectives (From admission, onward)   Start     Dose/Rate Route Frequency Ordered Stop   10/14/19 1000  remdesivir 100  mg in sodium chloride 0.9 % 100 mL IVPB     100 mg 200 mL/hr over 30 Minutes Intravenous Daily 10/13/19 1253 10/18/19 0959   10/13/19 1400  remdesivir 200 mg in sodium chloride 0.9% 250 mL IVPB     200 mg 580 mL/hr over 30 Minutes Intravenous Once 10/13/19 1253 10/13/19 1506       Subjective: Patient seen and evaluated today with no new acute complaints or concerns. No acute concerns or events noted overnight.  He states that he is still having a mild cough and has the beginnings of a mild fever.  Objective: Vitals:   10/13/19 1829 10/13/19 1952 10/13/19 2128 10/14/19 0507  BP: (!) 133/94  131/90 121/84  Pulse: (!) 114  (!) 103 92  Resp: 16  20 18   Temp: 100.1 F (37.8 C)  100.3 F (37.9 C) 98.6 F (37 C)  TempSrc: Oral  Oral Oral  SpO2: 98% 98% 97% 97%  Weight:      Height:        Intake/Output Summary (Last 24 hours) at 10/14/2019 1216 Last data filed at 10/14/2019 0700 Gross per 24 hour  Intake 1478.06 ml  Output -  Net 1478.06 ml   Filed Weights   10/13/19 1010  Weight: 83.9 kg    Examination:  General exam: Appears calm and comfortable  Respiratory system: Clear to auscultation. Respiratory effort normal.  2 L nasal cannula oxygen. Cardiovascular system: S1 & S2 heard, RRR. No JVD, murmurs, rubs, gallops or clicks. No pedal edema. Gastrointestinal system: Abdomen is nondistended, soft and nontender. No organomegaly or masses felt. Normal bowel sounds heard. Central nervous system: Alert and oriented. No focal neurological deficits. Extremities: Symmetric 5 x 5 power. Skin: No rashes, lesions or ulcers Psychiatry: Judgement and insight appear normal. Mood & affect appropriate.     Data Reviewed: I have personally reviewed following labs and imaging studies  CBC: Recent Labs  Lab 10/13/19 1130 10/14/19  0629  WBC 6.1 6.1  NEUTROABS 5.2 5.4  HGB 15.5 14.1  HCT 44.6 42.7  MCV 92.7 95.7  PLT 99* [id number]*   Basic Metabolic Panel: Recent Labs  Lab 10/13/19 1130 10/14/19 0629  NA 135 139  K 3.2* 3.9  CL 97* 101  CO2 29 28  GLUCOSE 134* 163*  BUN 8 15  CREATININE 0.93 0.84  CALCIUM 8.2* 8.1*  MG  --  2.5*  PHOS  --  2.9   GFR: Estimated Creatinine Clearance: 104.1 mL/min (by C-G formula based on SCr of 0.84 mg/dL). Liver Function Tests: Recent Labs  Lab 10/13/19 1130 10/14/19 0629  AST 45* 52*  ALT 39 47*  ALKPHOS 56 52  BILITOT 0.7 0.7  PROT 7.6 6.9  ALBUMIN 3.7 3.2*   No results for input(s): LIPASE, AMYLASE in the last 168 hours. No results for input(s): AMMONIA in the last 168 hours. Coagulation Profile: No results for input(s): INR, PROTIME in the last 168 hours. Cardiac Enzymes: No results for input(s): CKTOTAL, CKMB, CKMBINDEX, TROPONINI in the last 168 hours. BNP (last 3 results) No results for input(s): PROBNP in the last 8760 hours. HbA1C: No results for input(s): HGBA1C in the last 72 hours. CBG: Recent Labs  Lab 10/13/19 2027 10/14/19 0024 10/14/19 0354 10/14/19 0737 10/14/19 1147  GLUCAP 157* 143* 130* 142* 161*   Lipid Profile: Recent Labs    10/13/19 1131  TRIG 120   Thyroid Function Tests: No results for input(s): TSH, T4TOTAL, FREET4, T3FREE, THYROIDAB in the last  72 hours. Anemia Panel: Recent Labs    10/13/19 1130  FERRITIN 839*   Sepsis Labs: Recent Labs  Lab 10/13/19 1130 10/13/19 1131 10/13/19 1346  PROCALCITON 0.24  --   --   LATICACIDVEN  --  1.2 1.4    Recent Results (from the past 240 hour(s))  Respiratory Panel by RT PCR (Flu A&B, Covid) - Nasopharyngeal Swab     Status: Abnormal   Collection Time: 10/13/19 11:29 AM   Specimen: Nasopharyngeal Swab  Result Value Ref Range Status   SARS Coronavirus 2 by RT PCR POSITIVE (A) NEGATIVE Final    Comment: RESULT CALLED TO, READ BACK BY AND VERIFIED WITH:  CARDWELL,L@1245  BY MATTHEWS, B 1.26.21 (NOTE) SARS-CoV-2 target nucleic acids are DETECTED. SARS-CoV-2 RNA is generally detectable in upper respiratory specimens  during the acute phase of infection. Positive results are indicative of the presence of the identified virus, but do not rule out bacterial infection or co-infection with other pathogens not detected by the test. Clinical correlation with patient history and other diagnostic information is necessary to determine patient infection status. The expected result is Negative. Fact Sheet for Patients:  PinkCheek.be Fact Sheet for Healthcare Providers: GravelBags.it This test is not yet approved or cleared by the [place] FDA and  has been authorized for detection and/or diagnosis of SARS-CoV-2 by FDA under an Emergency Use Authorization (EUA).  This EUA will remain in effect (meaning this test can be used ) for the duration of  the COVID-19 declaration under Section 564(b)(1) of the Act, 21 U.S.C. section 360bbb-3(b)(1), unless the authorization is terminated or revoked sooner.    Influenza A by PCR NEGATIVE NEGATIVE Final   Influenza B by PCR NEGATIVE NEGATIVE Final    Comment: (NOTE) The Xpert Xpress SARS-CoV-2/FLU/RSV assay is intended as an aid in  the diagnosis of influenza from Nasopharyngeal swab specimens and  should not be used as a sole basis for treatment. Nasal washings and  aspirates are unacceptable for Xpert Xpress SARS-CoV-2/FLU/RSV  testing. Fact Sheet for Patients: PinkCheek.be Fact Sheet for Healthcare Providers: GravelBags.it This test is not yet approved or cleared by the [place] FDA and  has been authorized for detection and/or diagnosis of SARS-CoV-2 by  FDA under an Emergency Use Authorization (EUA). This EUA will remain  in effect (meaning this test can be used) for the duration  of the  Covid-19 declaration under Section 564(b)(1) of the Act, 21  U.S.C. section 360bbb-3(b)(1), unless the authorization is  terminated or revoked. Performed at [facility], [address] [place], [place] [address]   Blood Culture (routine x 2)     Status: None (Preliminary result)   Collection Time: 10/13/19 11:31 AM   Specimen: Left Antecubital; Blood  Result Value Ref Range Status   Specimen Description LEFT ANTECUBITAL  Final   Special Requests   Final    BOTTLES DRAWN AEROBIC AND ANAEROBIC Blood Culture adequate volume   Culture   Final    NO GROWTH < 24 HOURS Performed at [facility], [address], [place],  [address]    Report Status PENDING  Incomplete  Blood Culture (routine x 2)     Status: None (Preliminary result)   Collection Time: 10/13/19 11:31 AM   Specimen: BLOOD  Result Value Ref Range Status   Specimen Description BLOOD  Final   Special Requests NONE  Final   Culture   Final    NO GROWTH < 24 HOURS Performed at [facility],  [address] [place], [place] [address]    Report Status PENDING  Incomplete         Radiology Studies: DG Chest Port 1 View  Result Date: 10/13/2019 CLINICAL DATA:  Fever this morning. The patient tested positive for COVID-19 10/09/2019. EXAM: PORTABLE CHEST 1 VIEW COMPARISON:  PA and lateral chest 10/11/2015. FINDINGS: Hazy airspace disease is seen in the periphery of the left mid lung. The lungs are otherwise clear. Heart size is normal. No pneumothorax or pleural effusion. IMPRESSION: Hazy airspace disease in the left mid lung worrisome for pneumonia. Electronically Signed   By: Inge Rise M.D.   On: 10/13/2019 11:23        Scheduled Meds: . vitamin C  500 mg Oral Daily  . buPROPion  150 mg Oral Daily  . enoxaparin (LOVENOX) injection  40 mg Subcutaneous Q24H  . insulin aspart  0-9 Units Subcutaneous Q4H  . lisinopril  10 mg Oral Daily  . methylPREDNISolone (SOLU-MEDROL) injection  0.5 mg/kg  Intravenous Q12H  . pantoprazole  40 mg Oral Daily  . zinc sulfate  220 mg Oral Daily   Continuous Infusions: . 0.9 % NaCl with KCl 20 mEq / L 75 mL/hr at 10/14/19 0457  . remdesivir 100 mg in NS 100 mL 100 mg (10/14/19 0917)     LOS: 1 day    Time spent: 30 minutes    Yaritsa Savarino Darleen Crocker, DO [facility] Pager [phone]  If 7PM-7AM, please contact night-coverage [email or web address] Password [facility] 10/14/2019, 12:16 PM

## 2019-10-15 DIAGNOSIS — J9601 Acute respiratory failure with hypoxia: Secondary | ICD-10-CM | POA: Diagnosis not present

## 2019-10-15 LAB — COMPREHENSIVE METABOLIC PANEL
ALT: 64 U/L — ABNORMAL HIGH (ref 0–44)
AST: 56 U/L — ABNORMAL HIGH (ref 15–41)
Albumin: 3.4 g/dL — ABNORMAL LOW (ref 3.5–5.0)
Alkaline Phosphatase: 55 U/L (ref 38–126)
Anion gap: 9 (ref 5–15)
BUN: 21 mg/dL — ABNORMAL HIGH (ref 6–20)
CO2: 27 mmol/L (ref 22–32)
Calcium: 8.6 mg/dL — ABNORMAL LOW (ref 8.9–10.3)
Chloride: 105 mmol/L (ref 98–111)
Creatinine, Ser: 0.77 mg/dL (ref 0.61–1.24)
GFR calc Af Amer: >60 mL/min (ref >60)
GFR calc non Af Amer: >60 mL/min (ref >60)
Glucose, Bld: 162 mg/dL — ABNORMAL HIGH (ref 70–99)
Potassium: 3.7 mmol/L (ref 3.5–5.1)
Sodium: 141 mmol/L (ref 135–145)
Total Bilirubin: 0.8 mg/dL (ref 0.3–1.2)
Total Protein: 7.1 g/dL (ref 6.5–8.1)

## 2019-10-15 LAB — PHOSPHORUS: Phosphorus: 3.5 mg/dL (ref 2.5–4.6)

## 2019-10-15 LAB — CBC WITH DIFFERENTIAL/PLATELET
Abs Immature Granulocytes: 0.1 10*3/uL — ABNORMAL HIGH (ref 0.00–0.07)
Basophils Absolute: 0 10*3/uL (ref 0.0–0.1)
Basophils Relative: 0 %
Eosinophils Absolute: 0 10*3/uL (ref 0.0–0.5)
Eosinophils Relative: 0 %
HCT: 45.1 % (ref 39.0–52.0)
Hemoglobin: 14.9 g/dL (ref 13.0–17.0)
Immature Granulocytes: 1 %
Lymphocytes Relative: 10 %
Lymphs Abs: 1.1 10*3/uL (ref 0.7–4.0)
MCH: 31.4 pg (ref 26.0–34.0)
MCHC: 33 g/dL (ref 30.0–36.0)
MCV: 94.9 fL (ref 80.0–100.0)
Monocytes Absolute: 0.4 10*3/uL (ref 0.1–1.0)
Monocytes Relative: 3 %
Neutro Abs: 9.4 10*3/uL — ABNORMAL HIGH (ref 1.7–7.7)
Neutrophils Relative %: 86 %
Platelets: 192 10*3/uL (ref 150–400)
RBC: 4.75 MIL/uL (ref 4.22–5.81)
RDW: 12.4 % (ref 11.5–15.5)
WBC: 11 10*3/uL — ABNORMAL HIGH (ref 4.0–10.5)
nRBC: 0 % (ref 0.0–0.2)

## 2019-10-15 LAB — C-REACTIVE PROTEIN: CRP: 7.3 mg/dL — ABNORMAL HIGH (ref <1.0)

## 2019-10-15 LAB — D-DIMER, QUANTITATIVE: D-Dimer, Quant: 0.48 ug/mL-FEU (ref 0.00–0.50)

## 2019-10-15 LAB — GLUCOSE, CAPILLARY
Glucose-Capillary: 140 mg/dL — ABNORMAL HIGH (ref 70–99)
Glucose-Capillary: 148 mg/dL — ABNORMAL HIGH (ref 70–99)
Glucose-Capillary: 149 mg/dL — ABNORMAL HIGH (ref 70–99)

## 2019-10-15 LAB — MAGNESIUM: Magnesium: 2.2 mg/dL (ref 1.7–2.4)

## 2019-10-15 MED ORDER — ASCORBIC ACID 500 MG PO TABS: 500.0000 mg | ORAL_TABLET | Freq: Every day | ORAL | 0 refills | Status: AC

## 2019-10-15 MED ORDER — ALBUTEROL SULFATE HFA 108 (90 BASE) MCG/ACT IN AERS: 2.0000 | INHALATION_SPRAY | Freq: Four times a day (QID) | RESPIRATORY_TRACT | 0 refills | Status: AC | PRN

## 2019-10-15 MED ORDER — DEXAMETHASONE 6 MG PO TABS: 6.0000 mg | ORAL_TABLET | Freq: Every day | ORAL | 0 refills | Status: AC

## 2019-10-15 MED ORDER — PANTOPRAZOLE SODIUM 40 MG PO TBEC: 40.0000 mg | DELAYED_RELEASE_TABLET | Freq: Every day | ORAL | 0 refills | Status: AC

## 2019-10-15 MED ORDER — GUAIFENESIN-DM 100-10 MG/5ML PO SYRP: 10.0000 mL | ORAL_SOLUTION | ORAL | 0 refills | Status: AC | PRN

## 2019-10-15 MED ORDER — LOSARTAN POTASSIUM 50 MG PO TABS
25.0000 mg | ORAL_TABLET | Freq: Every morning | ORAL | Status: DC
  Administered 2019-10-15: 25 mg via ORAL
  Filled 2019-10-15: qty 1

## 2019-10-15 MED ORDER — ZINC SULFATE 220 (50 ZN) MG PO CAPS: 220.0000 mg | ORAL_CAPSULE | Freq: Every day | ORAL | 0 refills | Status: AC

## 2019-10-15 NOTE — Discharge Summary (Signed)
Physician Discharge Summary  Jordan Reid [id number] DOB: 01/25/1966 DOA: 10/13/2019  PCP: Celene Squibb, MD  Admit date: 10/13/2019  Discharge date: 10/15/2019  Admitted From:Home  Disposition:  Home  Recommendations for Outpatient Follow-up:  1. Follow up with PCP in 1-2 weeks 2. Continue on decadron for 7 more days to complete 10-day course 3. Continue on zinc and vitamin C as prescribed 4. Use albuterol and antitussives as needed 5. Continue with outpatient remdesivir infusions as scheduled through 1/30 to complete 5-day course  Home Health: None  Equipment/Devices: None  Discharge Condition: Stable  CODE STATUS: Full  Diet recommendation: Heart Healthy  Brief/Interim Summary: Per HPI: Jordan Varano Websteris a 54 y.o.malewith a past medical history significant for depression/anxiety, hypertension and primary hyperparathyroidism (status post parathyroidectomy in 2017); who presented to the emergency department secondary to worsening shortness of breath, general malaise, fever and hypoxia. Patient with recent positive diagnosis of Covid was monitoring himself and using supportive care at home. Despite the use of antipyretics (Tylenol) he continues spiking high-grade fever and his breathing decompensates. Patient reported that his wife check oxygen saturation and was as low at 84% at home. Patient expressed also some decrease intake and intermittent nausea. No vomiting, no chest pain, no blurred vision, headaches, hematuria, dysuria, melena, hematochezia, focal weakness, hemoptysis or any other complaints. Patient is no previously on oxygen supplementation at home.  In the ED chest x-ray demonstrated bilateral interstitial airspace disease (left more than right), patient was found to be hypoxic 87-88at rest with improvement to 94-96 percent on 2 L nasal cannula supplementation; positive SARS Covid confirmatory test and elevated inflammatory markers. Patient initiated on  remdesivir, steroids and oxygen supplementation. TR[facility] has been consulted patient for further evaluation and management. Mild hypokalemia and hypochloremia also appreciated.  1/27: Patient was admitted with acute hypoxemic respiratory failure in the setting of COVID-19 pneumonia.  He has been weaned off oxygen, but continues to remain symptomatic.  His inflammatory markers are still quite elevated and upward trending today.  We will plan to continue current IV medications in the inpatient setting and anticipate discharge in a.m. if stabilized and improved.  1/28: CRP levels have trended down this morning and patient has been afebrile over the last 24 hours.  He is overall doing well and aside from the dry cough denies any further symptoms.  He has been weaned off oxygen as well over the last 24 hours.  Inflammatory markers are now downtrending.  He will require 2 more days of outpatient infusions which have been scheduled.  He is to remain on his steroids as scheduled as well.  No other acute events noted throughout the course of this admission.  Discharge Diagnoses:  Principal Problem:   Acute hypoxemic respiratory failure (HCC) Active Problems:   Hyperparathyroidism, primary (HCC)   HTN (hypertension), benign   Hypokalemia   GERD (gastroesophageal reflux disease)  Principal discharge diagnosis: Acute hypoxemic respiratory failure in the setting of COVID-19 pneumonia  Discharge Instructions  Discharge Instructions    Diet - low sodium heart healthy   Complete by: As directed    Increase activity slowly   Complete by: As directed      Allergies as of 10/15/2019   No Known Allergies     Medication List    STOP taking these medications   azithromycin 250 MG tablet Commonly known as: ZITHROMAX     TAKE these medications   albuterol 108 (90 Base) MCG/ACT inhaler Commonly known as: VENTOLIN HFA Inhale  2 puffs into the lungs every 6 (six) hours as needed for wheezing or shortness of  breath.   ascorbic acid 500 MG tablet Commonly known as: VITAMIN C Take 1 tablet (500 mg total) by mouth daily. Start taking on: October 16, 2019   aspirin EC 81 MG tablet Take 162 mg by mouth daily.   buPROPion 150 MG 24 hr tablet Commonly known as: WELLBUTRIN XL Take 150 mg by mouth every morning.   cetirizine 10 MG tablet Commonly known as: ZYRTEC Take 10 mg by mouth every evening.   cholecalciferol 25 MCG (1000 UNIT) tablet Commonly known as: VITAMIN D3 Take 1,000 Units by mouth daily.   dexamethasone 6 MG tablet Commonly known as: DECADRON Take 1 tablet (6 mg total) by mouth daily for 7 days.   guaiFENesin-dextromethorphan 100-10 MG/5ML syrup Commonly known as: ROBITUSSIN DM Take 10 mLs by mouth every 4 (four) hours as needed for cough.   ibuprofen 200 MG tablet Commonly known as: ADVIL Take 200 mg by mouth every 6 (six) hours as needed for mild pain or moderate pain.   Krill Oil 500 MG Caps Take 1 capsule by mouth daily.   losartan 25 MG tablet Commonly known as: COZAAR Take 25 mg by mouth every morning.   pantoprazole 40 MG tablet Commonly known as: PROTONIX Take 1 tablet (40 mg total) by mouth daily for 10 days. Start taking on: October 16, 2019   RED YEAST RICE PO Take 1 tablet by mouth daily.   zinc sulfate 220 (50 Zn) MG capsule Take 1 capsule (220 mg total) by mouth daily. Start taking on: October 16, 2019      Follow-up Information    Celene Squibb, MD Follow up in 2 week(s).   Specialty: Internal Medicine Contact information: [place] [address] [phone]          No Known Allergies  Consultations:  None   Procedures/Studies: DG Chest Port 1 View  Result Date: 10/13/2019 CLINICAL DATA:  Fever this morning. The patient tested positive for COVID-19 10/09/2019. EXAM: PORTABLE CHEST 1 VIEW COMPARISON:  PA and lateral chest 10/11/2015. FINDINGS: Hazy airspace disease is seen in the periphery of the left mid  lung. The lungs are otherwise clear. Heart size is normal. No pneumothorax or pleural effusion. IMPRESSION: Hazy airspace disease in the left mid lung worrisome for pneumonia. Electronically Signed   By: Inge Rise M.D.   On: 10/13/2019 11:23      Discharge Exam: Vitals:   10/14/19 2011 10/15/19 0416  BP: (!) 147/99 (!) 134/95  Pulse: (!) 122 (!) 113  Resp: 20 20  Temp: 98.7 F (37.1 C) 99 F (37.2 C)  SpO2: 92% 90%   Vitals:   10/14/19 1330 10/14/19 1727 10/14/19 2011 10/15/19 0416  BP: (!) 155/92 (!) 146/103 (!) 147/99 (!) 134/95  Pulse: (!) 128 (!) 113 (!) 122 (!) 113  Resp: 18 20 20 20   Temp: 98.6 F (37 C) 98.7 F (37.1 C) 98.7 F (37.1 C) 99 F (37.2 C)  TempSrc: Oral Oral Oral Oral  SpO2: 93% 93% 92% 90%  Weight:      Height:        General: Pt is alert, awake, not in acute distress Cardiovascular: RRR, S1/S2 +, no rubs, no gallops Respiratory: CTA bilaterally, no wheezing, no rhonchi Abdominal: Soft, NT, ND, bowel sounds + Extremities: no edema, no cyanosis    The results of significant diagnostics from this hospitalization (including  imaging, microbiology, ancillary and laboratory) are listed below for reference.     Microbiology: Recent Results (from the past 240 hour(s))  Respiratory Panel by RT PCR (Flu A&B, Covid) - Nasopharyngeal Swab     Status: Abnormal   Collection Time: 10/13/19 11:29 AM   Specimen: Nasopharyngeal Swab  Result Value Ref Range Status   SARS Coronavirus 2 by RT PCR POSITIVE (A) NEGATIVE Final    Comment: RESULT CALLED TO, READ BACK BY AND VERIFIED WITH: CARDWELL,L@1245  BY MATTHEWS, B 1.26.21 (NOTE) SARS-CoV-2 target nucleic acids are DETECTED. SARS-CoV-2 RNA is generally detectable in upper respiratory specimens  during the acute phase of infection. Positive results are indicative of the presence of the identified virus, but do not rule out bacterial infection or co-infection with other pathogens not detected by the  test. Clinical correlation with patient history and other diagnostic information is necessary to determine patient infection status. The expected result is Negative. Fact Sheet for Patients:  PinkCheek.be Fact Sheet for Healthcare Providers: GravelBags.it This test is not yet approved or cleared by the [place] FDA and  has been authorized for detection and/or diagnosis of SARS-CoV-2 by FDA under an Emergency Use Authorization (EUA).  This EUA will remain in effect (meaning this test can be used ) for the duration of  the COVID-19 declaration under Section 564(b)(1) of the Act, 21 U.S.C. section 360bbb-3(b)(1), unless the authorization is terminated or revoked sooner.    Influenza A by PCR NEGATIVE NEGATIVE Final   Influenza B by PCR NEGATIVE NEGATIVE Final    Comment: (NOTE) The Xpert Xpress SARS-CoV-2/FLU/RSV assay is intended as an aid in  the diagnosis of influenza from Nasopharyngeal swab specimens and  should not be used as a sole basis for treatment. Nasal washings and  aspirates are unacceptable for Xpert Xpress SARS-CoV-2/FLU/RSV  testing. Fact Sheet for Patients: PinkCheek.be Fact Sheet for Healthcare Providers: GravelBags.it This test is not yet approved or cleared by the [place] FDA and  has been authorized for detection and/or diagnosis of SARS-CoV-2 by  FDA under an Emergency Use Authorization (EUA). This EUA will remain  in effect (meaning this test can be used) for the duration of the  Covid-19 declaration under Section 564(b)(1) of the Act, 21  U.S.C. section 360bbb-3(b)(1), unless the authorization is  terminated or revoked. Performed at [facility], [address] [place], [place] [address]   Blood Culture (routine x 2)     Status: None (Preliminary result)   Collection Time: 10/13/19 11:31 AM   Specimen: Left Antecubital; Blood   Result Value Ref Range Status   Specimen Description LEFT ANTECUBITAL  Final   Special Requests   Final    BOTTLES DRAWN AEROBIC AND ANAEROBIC Blood Culture adequate volume   Culture   Final    NO GROWTH 2 DAYS Performed at [facility], [address], [place], [place] [address]    Report Status PENDING  Incomplete  Blood Culture (routine x 2)     Status: None (Preliminary result)   Collection Time: 10/13/19 11:31 AM   Specimen: Right Antecubital; Blood  Result Value Ref Range Status   Specimen Description RIGHT ANTECUBITAL  Final   Special Requests   Final    BOTTLES DRAWN AEROBIC AND ANAEROBIC Blood Culture adequate volume   Culture   Final    NO GROWTH 2 DAYS Performed at [facility], [address], [place], [place] [address]    Report Status PENDING  Incomplete     Labs:  BNP (last 3 results) Recent Labs    10/13/19 1131  BNP 0000000   Basic Metabolic Panel: Recent Labs  Lab 10/13/19 1130 10/14/19 0629 10/15/19 0714  NA 135 139 141  K 3.2* 3.9 3.7  CL 97* 101 105  CO2 29 28 27   GLUCOSE 134* 163* 162*  BUN 8 15 21*  CREATININE 0.93 0.84 0.77  CALCIUM 8.2* 8.1* 8.6*  MG  --  2.5* 2.2  PHOS  --  2.9 3.5   Liver Function Tests: Recent Labs  Lab 10/13/19 1130 10/14/19 0629 10/15/19 0714  AST 45* 52* 56*  ALT 39 47* 64*  ALKPHOS 56 52 55  BILITOT 0.7 0.7 0.8  PROT 7.6 6.9 7.1  ALBUMIN 3.7 3.2* 3.4*   No results for input(s): LIPASE, AMYLASE in the last 168 hours. No results for input(s): AMMONIA in the last 168 hours. CBC: Recent Labs  Lab 10/13/19 1130 10/14/19 0629 10/15/19 0714  WBC 6.1 6.1 11.0*  NEUTROABS 5.2 5.4 9.4*  HGB 15.5 14.1 14.9  HCT 44.6 42.7 45.1  MCV 92.7 95.7 94.9  PLT 99* 118* 192   Cardiac Enzymes: No results for input(s): CKTOTAL, CKMB, CKMBINDEX, TROPONINI in the last 168 hours. BNP: Invalid input(s): POCBNP CBG: Recent Labs  Lab 10/14/19 1503 10/14/19 2013 10/15/19 0000 10/15/19 0417 10/15/19 0800  GLUCAP 148*  184* 140* 148* 149*   D-Dimer Recent Labs    10/14/19 0629 10/15/19 0714  DDIMER 0.52* 0.48   Hgb A1c No results for input(s): HGBA1C in the last 72 hours. Lipid Profile Recent Labs    10/13/19 1131  TRIG 120   Thyroid function studies No results for input(s): TSH, T4TOTAL, T3FREE, THYROIDAB in the last 72 hours.  Invalid input(s): FREET3 Anemia work up Recent Labs    10/13/19 1130  FERRITIN 839*   Urinalysis    Component Value Date/Time   COLORURINE YELLOW 10/11/2015 2011   [place] 10/11/2015 2011   [place] 1.017 10/11/2015 2011   [place] 8.0 10/11/2015 2011   GLUCOSEU NEGATIVE 10/11/2015 2011   HGBUR NEGATIVE 10/11/2015 2011   BILIRUBINUR NEGATIVE 10/11/2015 2011   [place] NEGATIVE 10/11/2015 2011   PROTEINUR NEGATIVE 10/11/2015 2011   NITRITE NEGATIVE 10/11/2015 2011   LEUKOCYTESUR NEGATIVE 10/11/2015 2011   Sepsis Labs Invalid input(s): PROCALCITONIN,  WBC,  LACTICIDVEN Microbiology Recent Results (from the past 240 hour(s))  Respiratory Panel by RT PCR (Flu A&B, Covid) - Nasopharyngeal Swab     Status: Abnormal   Collection Time: 10/13/19 11:29 AM   Specimen: Nasopharyngeal Swab  Result Value Ref Range Status   SARS Coronavirus 2 by RT PCR POSITIVE (A) NEGATIVE Final    Comment: RESULT CALLED TO, READ BACK BY AND VERIFIED WITH: CARDWELL,L@1245  BY MATTHEWS, B 1.26.21 (NOTE) SARS-CoV-2 target nucleic acids are DETECTED. SARS-CoV-2 RNA is generally detectable in upper respiratory specimens  during the acute phase of infection. Positive results are indicative of the presence of the identified virus, but do not rule out bacterial infection or co-infection with other pathogens not detected by the test. Clinical correlation with patient history and other diagnostic information is necessary to determine patient infection status. The expected result is Negative. Fact Sheet for Patients:  PinkCheek.be Fact Sheet for  Healthcare Providers: GravelBags.it This test is not yet approved or cleared by the [place] FDA and  has been authorized for detection and/or diagnosis of SARS-CoV-2 by FDA under an Emergency Use Authorization (EUA).  This EUA will remain in effect (meaning this test  can be used ) for the duration of  the COVID-19 declaration under Section 564(b)(1) of the Act, 21 U.S.C. section 360bbb-3(b)(1), unless the authorization is terminated or revoked sooner.    Influenza A by PCR NEGATIVE NEGATIVE Final   Influenza B by PCR NEGATIVE NEGATIVE Final    Comment: (NOTE) The Xpert Xpress SARS-CoV-2/FLU/RSV assay is intended as an aid in  the diagnosis of influenza from Nasopharyngeal swab specimens and  should not be used as a sole basis for treatment. Nasal washings and  aspirates are unacceptable for Xpert Xpress SARS-CoV-2/FLU/RSV  testing. Fact Sheet for Patients: PinkCheek.be Fact Sheet for Healthcare Providers: GravelBags.it This test is not yet approved or cleared by the [place] FDA and  has been authorized for detection and/or diagnosis of SARS-CoV-2 by  FDA under an Emergency Use Authorization (EUA). This EUA will remain  in effect (meaning this test can be used) for the duration of the  Covid-19 declaration under Section 564(b)(1) of the Act, 21  U.S.C. section 360bbb-3(b)(1), unless the authorization is  terminated or revoked. Performed at [facility], [address] [place], [place] [address]   Blood Culture (routine x 2)     Status: None (Preliminary result)   Collection Time: 10/13/19 11:31 AM   Specimen: Left Antecubital; Blood  Result Value Ref Range Status   Specimen Description LEFT ANTECUBITAL  Final   Special Requests   Final    BOTTLES DRAWN AEROBIC AND ANAEROBIC Blood Culture adequate volume   Culture   Final    NO GROWTH 2 DAYS Performed at [facility], [address], [place], [place] [address]    Report Status PENDING  Incomplete  Blood Culture (routine x 2)     Status: None (Preliminary result)   Collection Time: 10/13/19 11:31 AM   Specimen: Right Antecubital; Blood  Result Value Ref Range Status   Specimen Description RIGHT ANTECUBITAL  Final   Special Requests   Final    BOTTLES DRAWN AEROBIC AND ANAEROBIC Blood Culture adequate volume   Culture   Final    NO GROWTH 2 DAYS Performed at [facility], [address] [place], [place] [address]    Report Status PENDING  Incomplete     Time coordinating discharge: 35 minutes  SIGNED:   Rodena Goldmann, DO [facility] 10/15/2019, 9:20 AM  If 7PM-7AM, please contact night-coverage [email or web address]

## 2019-10-15 NOTE — Progress Notes (Addendum)
Patient scheduled for outpatient Remdesivir infusion at 10:00 Friday 1/29, and  Saturday 1/30. Please advise them to report to [facility] at [address].  Drive to the security guard and tell them you are here for an infusion. They will direct you to the front entrance where we will come and get you.  For questions call [phone].  Thanks

## 2019-10-15 NOTE — Discharge Instructions (Addendum)
You are scheduled for an outpatient infusion of Remdesivir at 10:00 Friday 1/29, and Saturday 1/30.  Please report to [facility] at [address].  Drive to the security guard and tell them you are here for an infusion. They will direct you to the front entrance where we will come and get you.  For questions call [phone].  Thanks

## 2019-10-16 ENCOUNTER — Ambulatory Visit (HOSPITAL_COMMUNITY)
Admission: RE | Admit: 2019-10-16 | Discharge: 2019-10-16 | Disposition: A | Discharge status: Home / Self Care | Payer: Federal, State, Local not specified - PPO | Source: Ambulatory Visit | Attending: Pulmonary Disease | Admitting: Pulmonary Disease

## 2019-10-16 VITALS — BP 137/92 | HR 107 | Temp 98.1°F | Resp 16

## 2019-10-16 DIAGNOSIS — U071 COVID-19: Secondary | ICD-10-CM | POA: Insufficient documentation

## 2019-10-16 MED ORDER — METHYLPREDNISOLONE SODIUM SUCC 125 MG IJ SOLR: 125.0000 mg | Freq: Once | INTRAMUSCULAR | Status: DC | PRN

## 2019-10-16 MED ORDER — DIPHENHYDRAMINE HCL 50 MG/ML IJ SOLN: 50.0000 mg | Freq: Once | INTRAMUSCULAR | Status: DC | PRN

## 2019-10-16 MED ORDER — SODIUM CHLORIDE 0.9 % IV SOLN
100.0000 mg | Freq: Once | INTRAVENOUS | Status: AC
  Administered 2019-10-16: 10:00:00 100 mg via INTRAVENOUS

## 2019-10-16 MED ORDER — SODIUM CHLORIDE 0.9 % IV SOLN
INTRAVENOUS | Status: AC
  Filled 2019-10-16: qty 20

## 2019-10-16 MED ORDER — FAMOTIDINE IN NACL 20-0.9 MG/50ML-% IV SOLN: 20.0000 mg | Freq: Once | INTRAVENOUS | Status: DC | PRN

## 2019-10-16 MED ORDER — EPINEPHRINE 0.3 MG/0.3ML IJ SOAJ: 0.3000 mg | Freq: Once | INTRAMUSCULAR | Status: DC | PRN

## 2019-10-16 MED ORDER — ALBUTEROL SULFATE HFA 108 (90 BASE) MCG/ACT IN AERS: 2.0000 | INHALATION_SPRAY | Freq: Once | RESPIRATORY_TRACT | Status: DC | PRN

## 2019-10-16 MED ORDER — SODIUM CHLORIDE 0.9 % IV SOLN: INTRAVENOUS | Status: DC | PRN

## 2019-10-16 NOTE — Discharge Instructions (Signed)
10 Things You Can Do to Manage Your COVID-19 Symptoms at Home If you have possible or confirmed COVID-19: 1. Stay home from work and school. And stay away from other public places. If you must go out, avoid using any kind of public transportation, ridesharing, or taxis. 2. Monitor your symptoms carefully. If your symptoms get worse, call your healthcare provider immediately. 3. Get rest and stay hydrated. 4. If you have a medical appointment, call the healthcare provider ahead of time and tell them that you have or may have COVID-19. 5. For medical emergencies, call 911 and notify the dispatch personnel that you have or may have COVID-19. 6. Cover your cough and sneezes with a tissue or use the inside of your elbow. 7. Wash your hands often with soap and water for at least 20 seconds or clean your hands with an alcohol-based hand sanitizer that contains at least 60% alcohol. 8. As much as possible, stay in a specific room and away from other people in your home. Also, you should use a separate bathroom, if available. If you need to be around other people in or outside of the home, wear a mask. 9. Avoid sharing personal items with other people in your household, like dishes, towels, and bedding. 10. Clean all surfaces that are touched often, like counters, tabletops, and doorknobs. Use household cleaning sprays or wipes according to the label instructions. cdc.gov/coronavirus 03/18/2019 This information is not intended to replace advice given to you by your health care provider. Make sure you discuss any questions you have with your health care provider. Document Revised: 08/20/2019 Document Reviewed: 08/20/2019 Elsevier Patient Education  2020 Elsevier Inc.  What types of side effects do monoclonal antibody drugs cause?  Common side effects  In general, the more common side effects caused by monoclonal antibody drugs include: . Allergic reactions, such as hives or itching . Flu-like signs and  symptoms, including chills, fatigue, fever, and muscle aches and pains . Nausea, vomiting . Diarrhea . Skin rashes . Low blood pressure   The CDC is recommending patients who receive monoclonal antibody treatments wait at least 90 days before being vaccinated.  Currently, there are no data on the safety and efficacy of mRNA COVID-19 vaccines in persons who received monoclonal antibodies or convalescent plasma as part of COVID-19 treatment. Based on the estimated half-life of such therapies as well as evidence suggesting that reinfection is uncommon in the 90 days after initial infection, vaccination should be deferred for at least 90 days, as a precautionary measure until additional information becomes available, to avoid interference of the antibody treatment with vaccine-induced immune responses.  What types of side effects do monoclonal antibody drugs cause?  Common side effects  In general, the more common side effects caused by monoclonal antibody drugs include: . Allergic reactions, such as hives or itching . Flu-like signs and symptoms, including chills, fatigue, fever, and muscle aches and pains . Nausea, vomiting . Diarrhea . Skin rashes . Low blood pressure   The CDC is recommending patients who receive monoclonal antibody treatments wait at least 90 days before being vaccinated.  Currently, there are no data on the safety and efficacy of mRNA COVID-19 vaccines in persons who received monoclonal antibodies or convalescent plasma as part of COVID-19 treatment. Based on the estimated half-life of such therapies as well as evidence suggesting that reinfection is uncommon in the 90 days after initial infection, vaccination should be deferred for at least 90 days, as a precautionary measure until   additional information becomes available, to avoid interference of the antibody treatment with vaccine-induced immune responses. 

## 2019-10-16 NOTE — Progress Notes (Signed)
  Diagnosis: COVID-19  Physician: DR. Joya Gaskins  Procedure: Covid Infusion Clinic Med: remdesivir infusion.  Complications: No immediate complications noted.  Discharge: Discharged home   Jordan Reid 10/16/2019

## 2019-10-17 ENCOUNTER — Ambulatory Visit (HOSPITAL_COMMUNITY)
Admission: RE | Admit: 2019-10-17 | Discharge: 2019-10-17 | Disposition: A | Discharge status: Home / Self Care | Payer: Federal, State, Local not specified - PPO | Source: Ambulatory Visit | Attending: Pulmonary Disease | Admitting: Pulmonary Disease

## 2019-10-17 DIAGNOSIS — U071 COVID-19: Secondary | ICD-10-CM | POA: Insufficient documentation

## 2019-10-17 MED ORDER — ALBUTEROL SULFATE HFA 108 (90 BASE) MCG/ACT IN AERS: 2.0000 | INHALATION_SPRAY | Freq: Once | RESPIRATORY_TRACT | Status: DC | PRN

## 2019-10-17 MED ORDER — DIPHENHYDRAMINE HCL 50 MG/ML IJ SOLN: 50.0000 mg | Freq: Once | INTRAMUSCULAR | Status: DC | PRN

## 2019-10-17 MED ORDER — SODIUM CHLORIDE 0.9 % IV SOLN
INTRAVENOUS | Status: AC
  Filled 2019-10-17: qty 20

## 2019-10-17 MED ORDER — EPINEPHRINE 0.3 MG/0.3ML IJ SOAJ: 0.3000 mg | Freq: Once | INTRAMUSCULAR | Status: DC | PRN

## 2019-10-17 MED ORDER — SODIUM CHLORIDE 0.9 % IV SOLN: INTRAVENOUS | Status: DC | PRN

## 2019-10-17 MED ORDER — FAMOTIDINE IN NACL 20-0.9 MG/50ML-% IV SOLN: 20.0000 mg | Freq: Once | INTRAVENOUS | Status: DC | PRN

## 2019-10-17 MED ORDER — SODIUM CHLORIDE 0.9 % IV SOLN
100.0000 mg | Freq: Once | INTRAVENOUS | Status: AC
  Administered 2019-10-17: 100 mg via INTRAVENOUS

## 2019-10-17 MED ORDER — METHYLPREDNISOLONE SODIUM SUCC 125 MG IJ SOLR: 125.0000 mg | Freq: Once | INTRAMUSCULAR | Status: DC | PRN

## 2019-10-17 NOTE — Discharge Instructions (Signed)
COVID-19 COVID-19 is a respiratory infection that is caused by a virus called severe acute respiratory syndrome coronavirus 2 (SARS-CoV-2). The disease is also known as coronavirus disease or novel coronavirus. In some people, the virus may not cause any symptoms. In others, it may cause a serious infection. The infection can get worse quickly and can lead to complications, such as:  Pneumonia, or infection of the lungs.  Acute respiratory distress syndrome or ARDS. This is a condition in which fluid build-up in the lungs prevents the lungs from filling with air and passing oxygen into the blood.  Acute respiratory failure. This is a condition in which there is not enough oxygen passing from the lungs to the body or when carbon dioxide is not passing from the lungs out of the body.  Sepsis or septic shock. This is a serious bodily reaction to an infection.  Blood clotting problems.  Secondary infections due to bacteria or fungus.  Organ failure. This is when your body's organs stop working. The virus that causes COVID-19 is contagious. This means that it can spread from person to person through droplets from coughs and sneezes (respiratory secretions). What are the causes? This illness is caused by a virus. You may catch the virus by:  Breathing in droplets from an infected person. Droplets can be spread by a person breathing, speaking, singing, coughing, or sneezing.  Touching something, like a table or a doorknob, that was exposed to the virus (contaminated) and then touching your mouth, nose, or eyes. What increases the risk? Risk for infection You are more likely to be infected with this virus if you:  Are within 6 feet (2 meters) of a person with COVID-19.  Provide care for or live with a person who is infected with COVID-19.  Spend time in crowded indoor spaces or live in shared housing. Risk for serious illness You are more likely to become seriously ill from the virus if  you:  Are 50 years of age or older. The higher your age, the more you are at risk for serious illness.  Live in a nursing home or long-term care facility.  Have cancer.  Have a long-term (chronic) disease such as: ? Chronic lung disease, including chronic obstructive pulmonary disease or asthma. ? A long-term disease that lowers your body's ability to fight infection (immunocompromised). ? Heart disease, including heart failure, a condition in which the arteries that lead to the heart become narrow or blocked (coronary artery disease), a disease which makes the heart muscle thick, weak, or stiff (cardiomyopathy). ? Diabetes. ? Chronic kidney disease. ? Sickle cell disease, a condition in which red blood cells have an abnormal "sickle" shape. ? Liver disease.  Are obese. What are the signs or symptoms? Symptoms of this condition can range from mild to severe. Symptoms may appear any time from 2 to 14 days after being exposed to the virus. They include:  A fever or chills.  A cough.  Difficulty breathing.  Headaches, body aches, or muscle aches.  Runny or stuffy (congested) nose.  A sore throat.  New loss of taste or smell. Some people may also have stomach problems, such as nausea, vomiting, or diarrhea. Other people may not have any symptoms of COVID-19. How is this diagnosed? This condition may be diagnosed based on:  Your signs and symptoms, especially if: ? You live in an area with a COVID-19 outbreak. ? You recently traveled to or from an area where the virus is common. ? You   provide care for or live with a person who was diagnosed with COVID-19. ? You were exposed to a person who was diagnosed with COVID-19.  A physical exam.  Lab tests, which may include: ? Taking a sample of fluid from the back of your nose and throat (nasopharyngeal fluid), your nose, or your throat using a swab. ? A sample of mucus from your lungs (sputum). ? Blood tests.  Imaging tests,  which may include, X-rays, CT scan, or ultrasound. How is this treated? At present, there is no medicine to treat COVID-19. Medicines that treat other diseases are being used on a trial basis to see if they are effective against COVID-19. Your health care provider will talk with you about ways to treat your symptoms. For most people, the infection is mild and can be managed at home with rest, fluids, and over-the-counter medicines. Treatment for a serious infection usually takes places in a hospital intensive care unit (ICU). It may include one or more of the following treatments. These treatments are given until your symptoms improve.  Receiving fluids and medicines through an IV.  Supplemental oxygen. Extra oxygen is given through a tube in the nose, a face mask, or a hood.  Positioning you to lie on your stomach (prone position). This makes it easier for oxygen to get into the lungs.  Continuous positive airway pressure (CPAP) or bi-level positive airway pressure (BPAP) machine. This treatment uses mild air pressure to keep the airways open. A tube that is connected to a motor delivers oxygen to the body.  Ventilator. This treatment moves air into and out of the lungs by using a tube that is placed in your windpipe.  Tracheostomy. This is a procedure to create a hole in the neck so that a breathing tube can be inserted.  Extracorporeal membrane oxygenation (ECMO). This procedure gives the lungs a chance to recover by taking over the functions of the heart and lungs. It supplies oxygen to the body and removes carbon dioxide. Follow these instructions at home: Lifestyle  If you are sick, stay home except to get medical care. Your health care provider will tell you how long to stay home. Call your health care provider before you go for medical care.  Rest at home as told by your health care provider.  Do not use any products that contain nicotine or tobacco, such as cigarettes,  e-cigarettes, and chewing tobacco. If you need help quitting, ask your health care provider.  Return to your normal activities as told by your health care provider. Ask your health care provider what activities are safe for you. General instructions  Take over-the-counter and prescription medicines only as told by your health care provider.  Drink enough fluid to keep your urine pale yellow.  Keep all follow-up visits as told by your health care provider. This is important. How is this prevented?  There is no vaccine to help prevent COVID-19 infection. However, there are steps you can take to protect yourself and others from this virus. To protect yourself:   Do not travel to areas where COVID-19 is a risk. The areas where COVID-19 is reported change often. To identify high-risk areas and travel restrictions, check the CDC travel website: wwwnc.cdc.gov/travel/notices  If you live in, or must travel to, an area where COVID-19 is a risk, take precautions to avoid infection. ? Stay away from people who are sick. ? Wash your hands often with soap and water for 20 seconds. If soap and water   are not available, use an alcohol-based hand sanitizer. ? Avoid touching your mouth, face, eyes, or nose. ? Avoid going out in public, follow guidance from your state and local health authorities. ? If you must go out in public, wear a cloth face covering or face mask. Make sure your mask covers your nose and mouth. ? Avoid crowded indoor spaces. Stay at least 6 feet (2 meters) away from others. ? Disinfect objects and surfaces that are frequently touched every day. This may include:  Counters and tables.  Doorknobs and light switches.  Sinks and faucets.  Electronics, such as phones, remote controls, keyboards, computers, and tablets. To protect others: If you have symptoms of COVID-19, take steps to prevent the virus from spreading to others.  If you think you have a COVID-19 infection, contact  your health care provider right away. Tell your health care team that you think you may have a COVID-19 infection.  Stay home. Leave your house only to seek medical care. Do not use public transport.  Do not travel while you are sick.  Wash your hands often with soap and water for 20 seconds. If soap and water are not available, use alcohol-based hand sanitizer.  Stay away from other members of your household. Let healthy household members care for children and pets, if possible. If you have to care for children or pets, wash your hands often and wear a mask. If possible, stay in your own room, separate from others. Use a different bathroom.  Make sure that all people in your household wash their hands well and often.  Cough or sneeze into a tissue or your sleeve or elbow. Do not cough or sneeze into your hand or into the air.  Wear a cloth face covering or face mask. Make sure your mask covers your nose and mouth. Where to find more information  Centers for Disease Control and Prevention: www.cdc.gov/coronavirus/2019-ncov/index.html  World Health Organization: www.who.int/health-topics/coronavirus Contact a health care provider if:  You live in or have traveled to an area where COVID-19 is a risk and you have symptoms of the infection.  You have had contact with someone who has COVID-19 and you have symptoms of the infection. Get help right away if:  You have trouble breathing.  You have pain or pressure in your chest.  You have confusion.  You have bluish lips and fingernails.  You have difficulty waking from sleep.  You have symptoms that get worse. These symptoms may represent a serious problem that is an emergency. Do not wait to see if the symptoms will go away. Get medical help right away. Call your local emergency services (911 in the [place] Do not drive yourself to the hospital. Let the emergency medical personnel know if you think you have  COVID-19. Summary  COVID-19 is a respiratory infection that is caused by a virus. It is also known as coronavirus disease or novel coronavirus. It can cause serious infections, such as pneumonia, acute respiratory distress syndrome, acute respiratory failure, or sepsis.  The virus that causes COVID-19 is contagious. This means that it can spread from person to person through droplets from breathing, speaking, singing, coughing, or sneezing.  You are more likely to develop a serious illness if you are 50 years of age or older, have a weak immune system, live in a nursing home, or have chronic disease.  There is no medicine to treat COVID-19. Your health care provider will talk with you about ways to treat your symptoms.    Take steps to protect yourself and others from infection. Wash your hands often and disinfect objects and surfaces that are frequently touched every day. Stay away from people who are sick and wear a mask if you are sick. This information is not intended to replace advice given to you by your health care provider. Make sure you discuss any questions you have with your health care provider. Document Revised: 07/03/2019 Document Reviewed: 10/09/2018 Elsevier Patient Education  2020 Else[employer] Inc. What types of side effects do monoclonal antibody drugs cause?  Common side effects  In general, the more common side effects caused by monoclonal antibody drugs include: . Allergic reactions, such as hives or itching . Flu-like signs and symptoms, including chills, fatigue, fever, and muscle aches and pains . Nausea, vomiting . Diarrhea . Skin rashes . Low blood pressure   The CDC is recommending patients who receive monoclonal antibody treatments wait at least 90 days before being vaccinated.  Currently, there are no data on the safety and efficacy of mRNA COVID-19 vaccines in persons who received monoclonal antibodies or convalescent plasma as part of COVID-19 treatment. Based  on the estimated half-life of such therapies as well as evidence suggesting that reinfection is uncommon in the 90 days after initial infection, vaccination should be deferred for at least 90 days, as a precautionary measure until additional information becomes available, to avoid interference of the antibody treatment with vaccine-induced immune responses. 

## 2019-10-17 NOTE — Progress Notes (Signed)
  Diagnosis: COVID-19  Physician:  Procedure: [facility] Med: remdesivir infusion.  Complications: No immediate complications noted.  Discharge: Discharged home   [place] 10/17/2019

## 2019-10-18 ENCOUNTER — Ambulatory Visit (HOSPITAL_COMMUNITY): Payer: Federal, State, Local not specified - PPO

## 2019-10-18 LAB — CULTURE, BLOOD (ROUTINE X 2)
Culture: NO GROWTH
Culture: NO GROWTH
Special Requests: ADEQUATE
Special Requests: ADEQUATE

## 2019-10-27 DIAGNOSIS — Z6830 Body mass index (BMI) 30.0-30.9, adult: Secondary | ICD-10-CM | POA: Diagnosis not present

## 2019-10-27 DIAGNOSIS — Z23 Encounter for immunization: Secondary | ICD-10-CM | POA: Diagnosis not present

## 2019-10-27 DIAGNOSIS — E669 Obesity, unspecified: Secondary | ICD-10-CM | POA: Diagnosis not present

## 2019-10-27 DIAGNOSIS — J9601 Acute respiratory failure with hypoxia: Secondary | ICD-10-CM | POA: Diagnosis not present

## 2019-10-27 DIAGNOSIS — E782 Mixed hyperlipidemia: Secondary | ICD-10-CM | POA: Diagnosis not present

## 2019-10-27 DIAGNOSIS — I10 Essential (primary) hypertension: Secondary | ICD-10-CM | POA: Diagnosis not present

## 2019-10-27 DIAGNOSIS — U071 COVID-19: Secondary | ICD-10-CM | POA: Diagnosis not present

## 2020-02-08 DIAGNOSIS — E210 Primary hyperparathyroidism: Secondary | ICD-10-CM | POA: Diagnosis not present

## 2020-02-08 DIAGNOSIS — E663 Overweight: Secondary | ICD-10-CM | POA: Diagnosis not present

## 2020-02-08 DIAGNOSIS — E669 Obesity, unspecified: Secondary | ICD-10-CM | POA: Diagnosis not present

## 2020-02-08 DIAGNOSIS — E782 Mixed hyperlipidemia: Secondary | ICD-10-CM | POA: Diagnosis not present

## 2020-04-22 DIAGNOSIS — Z23 Encounter for immunization: Secondary | ICD-10-CM | POA: Diagnosis not present

## 2020-05-13 DIAGNOSIS — Z23 Encounter for immunization: Secondary | ICD-10-CM | POA: Diagnosis not present

## 2020-12-05 DIAGNOSIS — E669 Obesity, unspecified: Secondary | ICD-10-CM | POA: Diagnosis not present

## 2020-12-05 DIAGNOSIS — E782 Mixed hyperlipidemia: Secondary | ICD-10-CM | POA: Diagnosis not present

## 2020-12-05 DIAGNOSIS — E892 Postprocedural hypoparathyroidism: Secondary | ICD-10-CM | POA: Diagnosis not present

## 2020-12-05 DIAGNOSIS — Z125 Encounter for screening for malignant neoplasm of prostate: Secondary | ICD-10-CM | POA: Diagnosis not present

## 2020-12-05 DIAGNOSIS — I10 Essential (primary) hypertension: Secondary | ICD-10-CM | POA: Diagnosis not present

## 2020-12-05 DIAGNOSIS — Z0000 Encounter for general adult medical examination without abnormal findings: Secondary | ICD-10-CM | POA: Diagnosis not present

## 2021-02-22 IMAGING — DX DG CHEST 1V PORT
1 series · 1 of 1 positions shown · non-contrast
Comparison: PA and lateral chest 10/11/2015.

CLINICAL DATA: Fever this morning. The patient tested positive for
RCNSE-0N 10/09/2019.

EXAM:
PORTABLE CHEST 1 VIEW

[chest ap]
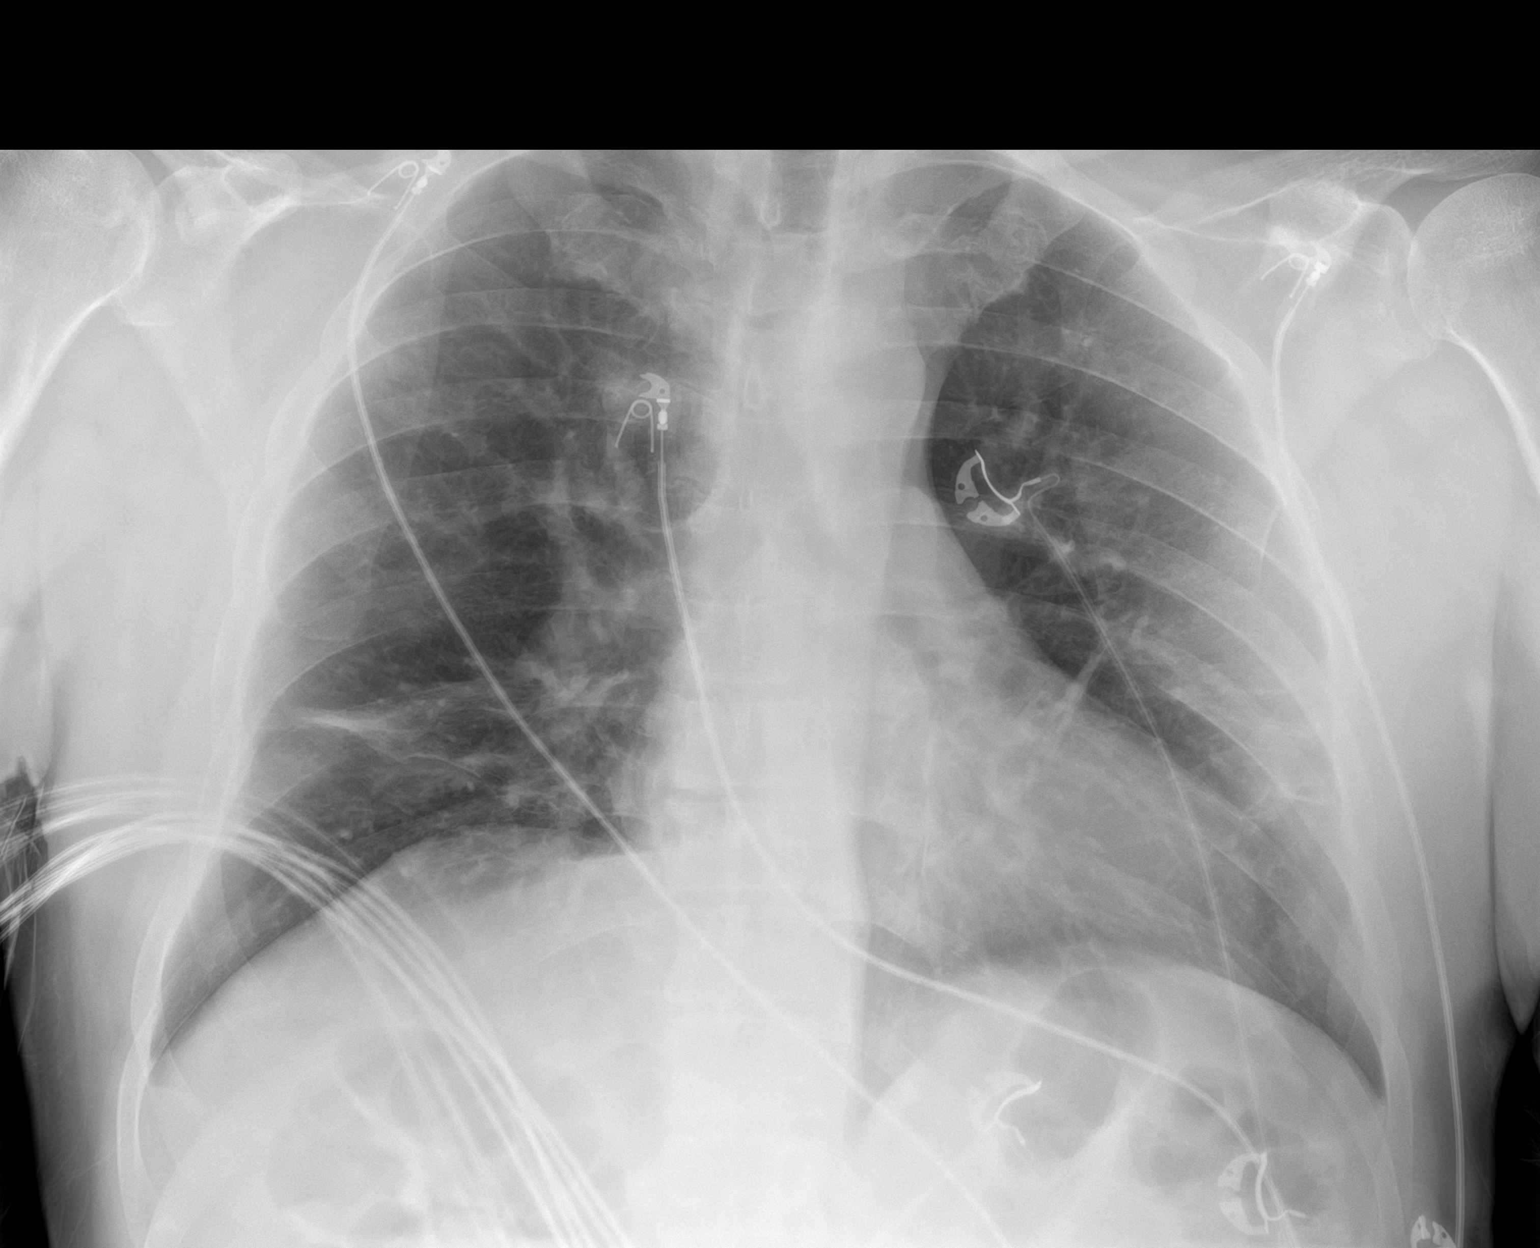

[1 of 1 positions shown; findings below may reference images not displayed]

FINDINGS: Hazy airspace disease is seen in the periphery of the left mid lung.
The lungs are otherwise clear. Heart size is normal. No pneumothorax
or pleural effusion.
IMPRESSION: Hazy airspace disease in the left mid lung worrisome for pneumonia.

## 2021-03-13 DIAGNOSIS — H40013 Open angle with borderline findings, low risk, bilateral: Secondary | ICD-10-CM | POA: Diagnosis not present

## 2021-03-13 DIAGNOSIS — H18711 Corneal ectasia, right eye: Secondary | ICD-10-CM | POA: Diagnosis not present

## 2021-03-13 DIAGNOSIS — H18603 Keratoconus, unspecified, bilateral: Secondary | ICD-10-CM | POA: Diagnosis not present

## 2021-03-13 DIAGNOSIS — H2513 Age-related nuclear cataract, bilateral: Secondary | ICD-10-CM | POA: Diagnosis not present

## 2021-06-21 DIAGNOSIS — H5989 Other postprocedural complications and disorders of eye and adnexa, not elsewhere classified: Secondary | ICD-10-CM | POA: Diagnosis not present

## 2021-06-21 DIAGNOSIS — Z9889 Other specified postprocedural states: Secondary | ICD-10-CM | POA: Diagnosis not present

## 2021-06-21 DIAGNOSIS — H18713 Corneal ectasia, bilateral: Secondary | ICD-10-CM | POA: Diagnosis not present

## 2024-03-09 ENCOUNTER — Telehealth: Payer: Self-pay | Admitting: Urology

## 2024-03-09 NOTE — Telephone Encounter (Signed)
 Dr D did vasectomy 10 years ago and he wants to see about checking sperm count. He never did follow up

## 2024-03-16 NOTE — Telephone Encounter (Signed)
 Per Dr. Matilda Yes, I can see that he had that done here in [place] in 2015. If you want to set up a visit for me to see him and do the same and check at that time, setting up an office visit would be fine. Left vm making patient aware he is scheduled for follow up office visited 08/12.

## 2024-04-27 NOTE — Progress Notes (Signed)
 04/28/2024 12:27 PM   Jordan Reid 06/02/1966 [id number]  Referring provider: Shona Norleen PEDLAR, MD [address] [place],  [place] [address]  No chief complaint on file.   HPI: This 58 year old male comes in today following vasectomy performed in 2015.  He never did follow-up.  He requests semen analysis.  He has not been using birth control.  They have not conceived yet.    PMH: Past Medical History:  Diagnosis Date   Anxiety    Hyperparathyroidism (HCC)    Hypertension     Surgical History: Past Surgical History:  Procedure Laterality Date   COLONOSCOPY N/A 01/26/2016   Procedure: COLONOSCOPY;  Surgeon: Claudis RAYMOND Rivet, MD;  Location: [facility];  Service: Endoscopy;  Laterality: N/A;  830   EYE SURGERY     lasik bilateral   PARATHYROIDECTOMY Left 07/23/2016   Procedure: LEFT PARATHYROIDECTOMY;  Surgeon: Krystal Spinner, MD;  Location: [facility];  Service: General;  Laterality: Left;   temporal lesion removed     left    Home Medications:  Allergies as of 04/28/2024   No Known Allergies      Medication List        Accurate as of April 27, 2024 12:27 PM. If you have any questions, ask your nurse or doctor.          albuterol  108 (90 Base) MCG/ACT inhaler Commonly known as: VENTOLIN  HFA Inhale 2 puffs into the lungs every 6 (six) hours as needed for wheezing or shortness of breath.   aspirin EC 81 MG tablet Take 162 mg by mouth daily.   buPROPion  150 MG 24 hr tablet Commonly known as: WELLBUTRIN  XL Take 150 mg by mouth every morning.   cetirizine 10 MG tablet Commonly known as: ZYRTEC Take 10 mg by mouth every evening.   cholecalciferol 25 MCG (1000 UNIT) tablet Commonly known as: VITAMIN D3 Take 1,000 Units by mouth daily.   guaiFENesin -dextromethorphan  100-10 MG/5ML syrup Commonly known as: ROBITUSSIN DM Take 10 mLs by mouth every 4 (four) hours as needed for cough.   ibuprofen 200 MG tablet Commonly known as: ADVIL Take 200 mg by mouth  every 6 (six) hours as needed for mild pain or moderate pain.   Krill Oil 500 MG Caps Take 1 capsule by mouth daily.   losartan  25 MG tablet Commonly known as: COZAAR  Take 25 mg by mouth every morning.   pantoprazole  40 MG tablet Commonly known as: PROTONIX  Take 1 tablet (40 mg total) by mouth daily for 10 days.   RED YEAST RICE PO Take 1 tablet by mouth daily.        Allergies: No Known Allergies  Family History: Family History  Problem Relation Age of Onset   Heart attack Father    Lung cancer Maternal Grandmother     Social History:  reports that he has never smoked. He has never used smokeless tobacco. He reports current alcohol use. He reports that he does not use drugs.  ROS: All other review of systems were reviewed and are negative except what is noted above in HPI  Physical Exam: There were no vitals taken for this visit.  Constitutional:  Alert and oriented, No acute distress. HEENT: [place] AT, moist mucus membranes.  Trachea midline, no masses. Cardiovascular: No clubbing, cyanosis, or edema. Skin: No rashes, bruises or suspicious lesions. Neurologic: Grossly intact, no focal deficits, moving all 4 extremities. Psychiatric: Normal mood and affect.  Laboratory Data: Lab Results  Component  Value Date   WBC 11.0 (H) 10/15/2019   HGB 14.9 10/15/2019   HCT 45.1 10/15/2019   MCV 94.9 10/15/2019   PLT 192 10/15/2019    Lab Results  Component Value Date   CREATININE 0.77 10/15/2019      Assessment:  10 years out from vas ectomy, he has not had postop semen analysis yet  Plan:   I will have him drop off semen specimen in 2 weeks.  I will take a look at it and let him know how it looks.  No follow-ups on file.  Garnette CHRISTELLA Shack, MD  [facility] [place]

## 2024-04-28 ENCOUNTER — Encounter: Payer: Self-pay | Admitting: Urology

## 2024-04-28 ENCOUNTER — Ambulatory Visit: Admitting: Urology

## 2024-04-28 VITALS — BP 143/84 | HR 99

## 2024-04-28 DIAGNOSIS — Z9852 Vasectomy status: Secondary | ICD-10-CM | POA: Diagnosis not present

## 2024-05-12 ENCOUNTER — Telehealth: Payer: Self-pay

## 2024-05-12 NOTE — Telephone Encounter (Signed)
 Called Pt and relay MD Dahlstedt's response . Pt voiced understanding

## 2024-05-12 NOTE — Telephone Encounter (Signed)
-----   Message from Garnette HERO Dahlstedt sent at 05/12/2024 11:19 AM EDT ----- Please call patient-good news, semen specimen shows no sperm.  No further follow-up needed.

## 2024-05-12 NOTE — Telephone Encounter (Signed)
 error
